# Patient Record
Sex: Female | Born: 1990 | Race: White | Hispanic: No | Marital: Married | Smoking: Never smoker
Health system: Southern US, Community
[De-identification: ages and names within clinical notes are randomized; demographics above are authoritative.]

## PROBLEM LIST (undated history)

## (undated) DIAGNOSIS — E669 Obesity, unspecified: Secondary | ICD-10-CM

## (undated) DIAGNOSIS — Z9189 Other specified personal risk factors, not elsewhere classified: Secondary | ICD-10-CM

## (undated) DIAGNOSIS — Z9289 Personal history of other medical treatment: Secondary | ICD-10-CM

## (undated) DIAGNOSIS — Z1371 Encounter for nonprocreative screening for genetic disease carrier status: Secondary | ICD-10-CM

## (undated) DIAGNOSIS — Z1379 Encounter for other screening for genetic and chromosomal anomalies: Secondary | ICD-10-CM

## (undated) DIAGNOSIS — Z803 Family history of malignant neoplasm of breast: Secondary | ICD-10-CM

## (undated) HISTORY — DX: Personal history of other medical treatment: Z92.89

## (undated) HISTORY — PX: BREAST BIOPSY: SHX20

## (undated) HISTORY — PX: TONSILLECTOMY: SUR1361

## (undated) HISTORY — DX: Encounter for nonprocreative screening for genetic disease carrier status: Z13.71

## (undated) HISTORY — DX: Other specified personal risk factors, not elsewhere classified: Z91.89

## (undated) HISTORY — DX: Family history of malignant neoplasm of breast: Z80.3

## (undated) HISTORY — DX: Encounter for other screening for genetic and chromosomal anomalies: Z13.79

## (undated) HISTORY — DX: Obesity, unspecified: E66.9

---
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
## 2014-04-01 DIAGNOSIS — Z1371 Encounter for nonprocreative screening for genetic disease carrier status: Secondary | ICD-10-CM

## 2014-04-01 DIAGNOSIS — Z1379 Encounter for other screening for genetic and chromosomal anomalies: Secondary | ICD-10-CM

## 2014-04-01 HISTORY — DX: Encounter for other screening for genetic and chromosomal anomalies: Z13.79

## 2014-04-01 HISTORY — DX: Encounter for nonprocreative screening for genetic disease carrier status: Z13.71

## 2014-04-30 LAB — HM PAP SMEAR: HM PAP: NEGATIVE

## 2014-12-17 ENCOUNTER — Encounter: Payer: Self-pay | Admitting: *Deleted

## 2015-01-07 ENCOUNTER — Ambulatory Visit (INDEPENDENT_AMBULATORY_CARE_PROVIDER_SITE_OTHER): Payer: PRIVATE HEALTH INSURANCE | Admitting: Obstetrics and Gynecology

## 2015-01-07 VITALS — BP 130/82 | HR 86 | Ht 70.0 in | Wt 220.2 lb

## 2015-01-07 DIAGNOSIS — E669 Obesity, unspecified: Secondary | ICD-10-CM

## 2015-01-07 NOTE — Progress Notes (Cosign Needed)
Patient ID: Renee JakschElizabeth A Lawrence, female   DOB: [birth year], 24 y.o.   MRN: [id number]  Pt here for weight, B/P check.  Missed last appt because when she came back from vacation she forgot. Does not want B-12 injection due to cost, so this was not given. On second rx for Phentermine.  Weight loss of 1 lb since last visit. B/P down from last visit. No side effects noted.

## 2015-02-04 ENCOUNTER — Ambulatory Visit (INDEPENDENT_AMBULATORY_CARE_PROVIDER_SITE_OTHER): Payer: PRIVATE HEALTH INSURANCE | Admitting: Obstetrics and Gynecology

## 2015-02-04 VITALS — BP 132/84 | HR 78 | Ht 70.0 in | Wt 215.2 lb

## 2015-02-04 DIAGNOSIS — E663 Overweight: Secondary | ICD-10-CM

## 2015-02-04 NOTE — Progress Notes (Cosign Needed)
Pt is here for wt, bp check, pt isnt getting b-12 inj due to cost She denies any s/e from Phentermine, lost 5 lbs this month and actually won a contest for most inches lost,pt was very proud

## 2015-03-02 ENCOUNTER — Ambulatory Visit: Payer: PRIVATE HEALTH INSURANCE

## 2015-03-22 ENCOUNTER — Ambulatory Visit (INDEPENDENT_AMBULATORY_CARE_PROVIDER_SITE_OTHER): Payer: PRIVATE HEALTH INSURANCE | Admitting: Obstetrics and Gynecology

## 2015-03-22 VITALS — BP 128/83 | HR 88 | Ht 70.0 in | Wt 208.5 lb

## 2015-03-22 DIAGNOSIS — E663 Overweight: Secondary | ICD-10-CM

## 2015-03-22 NOTE — Progress Notes (Cosign Needed)
Pt is here for wt, bp check- she doesn't get B-12 due to cost Denies any s/e  01/07/15 wt-220lb 03/23/15 wt- 208lb

## 2015-04-07 ENCOUNTER — Encounter: Payer: Self-pay | Admitting: Obstetrics and Gynecology

## 2015-04-07 ENCOUNTER — Ambulatory Visit (INDEPENDENT_AMBULATORY_CARE_PROVIDER_SITE_OTHER): Payer: BC Managed Care – PPO | Admitting: Obstetrics and Gynecology

## 2015-04-07 VITALS — BP 132/84 | HR 91 | Ht 70.0 in | Wt 214.2 lb

## 2015-04-07 DIAGNOSIS — E669 Obesity, unspecified: Secondary | ICD-10-CM | POA: Diagnosis not present

## 2015-04-07 MED ORDER — PHENTERMINE HCL 37.5 MG PO TABS: 37.5000 mg | ORAL_TABLET | Freq: Every day | ORAL | Status: DC

## 2015-04-07 NOTE — Progress Notes (Signed)
Patient ID: Tyna Jaksch, female   DOB: 26-Apr-1991, 24 y.o.   MRN: [id number]   SUBJECTIVE:  24 y.o. here for follow-up weight loss visit, previously seen 4 weeks ago. Denies any concerns and feels like medication worked well, had not been getting injection due to pharmacy cost $30). Has been off medication x 2 weeks and desires restart.  OBJECTIVE:  BP 132/84 mmHg  Pulse 91  Ht  (1.778 m)  Wt 214 lb 3.2 oz (97.16 kg)  BMI 30.73 kg/m2  LMP 03/10/2015 (Exact Date)  Body mass index is 30.73 kg/(m^2). Patient appears well. ASSESSMENT:  Obesity- responding well to weight loss plan PLAN:  To continue with current medications. B12 1033mcg/ml injection given- will give B12 from our supply each visit, RX for refills on Adipex given. RTC in 4 weeks as planned  Yolanda Bonine, CNM

## 2015-05-05 ENCOUNTER — Ambulatory Visit (INDEPENDENT_AMBULATORY_CARE_PROVIDER_SITE_OTHER): Payer: BC Managed Care – PPO | Admitting: Obstetrics and Gynecology

## 2015-05-05 VITALS — BP 121/81 | HR 86 | Ht 70.0 in | Wt 208.5 lb

## 2015-05-05 DIAGNOSIS — E669 Obesity, unspecified: Secondary | ICD-10-CM

## 2015-05-05 MED ORDER — CYANOCOBALAMIN 1000 MCG/ML IJ SOLN
1000.0000 ug | Freq: Once | INTRAMUSCULAR | Status: AC
  Administered 2015-05-05: 1000 ug via INTRAMUSCULAR

## 2015-05-05 NOTE — Progress Notes (Cosign Needed)
Patient ID: Renee JakschElizabeth A Lawrence, female   DOB: May 23, 1991, 24 y.o.   MRN: [id number] Pt presents for weight, B/P, B-12 injection. No side effects of medication-Phentermine, or B-12.  Weight loss of 6 lbs. Encouraged eating healthy and exercise.

## 2015-06-02 ENCOUNTER — Ambulatory Visit (INDEPENDENT_AMBULATORY_CARE_PROVIDER_SITE_OTHER): Payer: BC Managed Care – PPO | Admitting: Obstetrics and Gynecology

## 2015-06-02 VITALS — BP 125/84 | HR 81 | Ht 70.0 in | Wt 209.4 lb

## 2015-06-02 DIAGNOSIS — E669 Obesity, unspecified: Secondary | ICD-10-CM | POA: Diagnosis not present

## 2015-06-02 MED ORDER — CYANOCOBALAMIN 1000 MCG/ML IJ SOLN
1000.0000 ug | Freq: Once | INTRAMUSCULAR | Status: AC
  Administered 2015-06-02: 1000 ug via INTRAMUSCULAR

## 2015-06-02 NOTE — Progress Notes (Cosign Needed)
Patient ID: Renee JakschElizabeth A Lawrence, female   DOB: 01/02/1991, 24 y.o.   MRN: [id number] Pt presents for weight, B/P, B-12 injection. No side effects of medication-Phentermine, or B-12.  Weight gain of 1 lbs. Encouraged eating healthy and exercise. Pt did have on boots, jeans today as clothing is usually lighter.

## 2015-07-14 ENCOUNTER — Encounter: Payer: Self-pay | Admitting: Obstetrics and Gynecology

## 2015-07-14 ENCOUNTER — Ambulatory Visit (INDEPENDENT_AMBULATORY_CARE_PROVIDER_SITE_OTHER): Payer: BC Managed Care – PPO | Admitting: Obstetrics and Gynecology

## 2015-07-14 VITALS — BP 133/79 | HR 75 | Wt 212.5 lb

## 2015-07-14 DIAGNOSIS — J069 Acute upper respiratory infection, unspecified: Secondary | ICD-10-CM | POA: Diagnosis not present

## 2015-07-14 DIAGNOSIS — E669 Obesity, unspecified: Secondary | ICD-10-CM

## 2015-07-14 MED ORDER — PHENTERMINE HCL 37.5 MG PO TABS: 37.5000 mg | ORAL_TABLET | Freq: Every day | ORAL | Status: DC

## 2015-07-14 MED ORDER — CEFDINIR 300 MG PO CAPS: 300.0000 mg | ORAL_CAPSULE | Freq: Two times a day (BID) | ORAL | Status: DC

## 2015-07-14 NOTE — Progress Notes (Signed)
SUBJECTIVE:  25 y.o. here for follow-up weight loss visit, previously seen 4 weeks ago. Denies any concerns and feels like medication worked well, and has been off x 2 weeks, but gained 3 # back with current cold and not able to exercise, does desires another 3 months to continue to lose weight. Has joined gym and is in weight loss challenge there.   Report onset nasal congestion with chest congestion x 1 week, denies fever, throat sore with swallowing. Green phlegm noted.  OBJECTIVE:  BP 133/79 mmHg  Pulse 75  Wt 212 lb 8 oz (96.389 kg)  LMP 06/20/2015  Body mass index is 30.49 kg/(m^2). Patient appears sickly- lungs with wet cough noted, nasal passages boggy with green discharge noted. + lymphadenopathy bilaterally. ASSESSMENT:  Obesity- responding well to weight loss plan PLAN:   RX for omnicef 300mg  po bid x 7 das, OK to continue with Mucinex D as needed.  Then continue with current medications after 1 week. B12 104200mcg/ml injection given RTC in 5 weeks as planned  Melody LarrabeeShambley, CNM

## 2015-08-18 ENCOUNTER — Ambulatory Visit (INDEPENDENT_AMBULATORY_CARE_PROVIDER_SITE_OTHER): Payer: BC Managed Care – PPO | Admitting: Obstetrics and Gynecology

## 2015-08-18 VITALS — BP 131/96 | HR 96 | Wt 212.0 lb

## 2015-08-18 DIAGNOSIS — E669 Obesity, unspecified: Secondary | ICD-10-CM | POA: Diagnosis not present

## 2015-08-18 MED ORDER — CYANOCOBALAMIN 1000 MCG/ML IJ SOLN
1000.0000 ug | Freq: Once | INTRAMUSCULAR | Status: AC
  Administered 2015-08-18: 1000 ug via INTRAMUSCULAR

## 2015-08-18 NOTE — Progress Notes (Cosign Needed)
Patient ID: Renee Lawrence, female   DOB: 05/07/1991, 25 y.o.   MRN: [id number] Pt presents for weight, B/P, B-12 injection. No side effects of medication-Phentermine, or B-12.  Weight stable at 212 lb.  Encouraged eating healthy and exercise.

## 2015-09-15 ENCOUNTER — Ambulatory Visit (INDEPENDENT_AMBULATORY_CARE_PROVIDER_SITE_OTHER): Payer: BC Managed Care – PPO | Admitting: Obstetrics and Gynecology

## 2015-09-15 VITALS — BP 134/84 | HR 78 | Wt 216.1 lb

## 2015-09-15 DIAGNOSIS — E669 Obesity, unspecified: Secondary | ICD-10-CM | POA: Diagnosis not present

## 2015-09-15 MED ORDER — CYANOCOBALAMIN 1000 MCG/ML IJ SOLN
1000.0000 ug | Freq: Once | INTRAMUSCULAR | Status: AC
  Administered 2015-09-15: 1000 ug via INTRAMUSCULAR

## 2015-09-15 NOTE — Progress Notes (Signed)
Patient ID: Renee JakschElizabeth A Lawrence, female   DOB: 10-03-1990, 25 y.o.   MRN: [id number] Pt presents for weight, B/P, B-12 injection. No side effects of medication-Phentermine, or B-12.  Weight gain of 4 lbs. Encouraged eating healthy and exercise. Pt states she is stopping her phentermine and B-12 injection. Pt has a lot going on. [facility] but wants to do her next one here.

## 2015-10-06 ENCOUNTER — Telehealth: Payer: Self-pay | Admitting: Obstetrics and Gynecology

## 2015-10-06 ENCOUNTER — Other Ambulatory Visit: Payer: Self-pay | Admitting: Obstetrics and Gynecology

## 2015-10-06 NOTE — Telephone Encounter (Signed)
Pt does not have a primary dr. She has hjad diarrhea since Monday. She has taken immodium, pepto for it and its not working. Can you call her. Now she has a hemarhoid that is hanging out

## 2015-10-06 NOTE — Telephone Encounter (Signed)
Pt is still having diarrhea, she is concerned b/c she has had to have a hemorrhoid lanced, she feels as if that's whats going on?? pls advise- cant we lance it???

## 2015-10-06 NOTE — Telephone Encounter (Signed)
Come in to be seen tomorrow

## 2015-10-07 NOTE — Telephone Encounter (Signed)
I CALLED PT AND LEFT HER A MESSAGE TO CALL OFFICE FIRST THING THIS MORNING SINCE IT WAS AFTER 5 TO GET AN APPT. SHE CALLED THIS MORNING AND STATED SHE WAS GOING TO WAIT THRU THE WEEKEND AND SEE IF ITS BETTER AND IF NOT THEN CALL [place]

## 2016-09-20 ENCOUNTER — Ambulatory Visit: Payer: Self-pay | Admitting: Obstetrics and Gynecology

## 2016-11-02 ENCOUNTER — Ambulatory Visit: Payer: Self-pay | Admitting: Obstetrics and Gynecology

## 2016-11-14 ENCOUNTER — Telehealth: Payer: Self-pay

## 2016-11-14 MED ORDER — NORETHIN ACE-ETH ESTRAD-FE 1-20 MG-MCG PO TABS: 1.0000 | ORAL_TABLET | Freq: Every day | ORAL | 1 refills | Status: DC

## 2016-11-14 NOTE — Telephone Encounter (Signed)
Pt calling for refill of bcps to get her to her annual on 6/26th. Pt aware refills eRx'd.

## 2016-12-25 ENCOUNTER — Encounter: Payer: Self-pay | Admitting: Obstetrics and Gynecology

## 2016-12-25 ENCOUNTER — Ambulatory Visit (INDEPENDENT_AMBULATORY_CARE_PROVIDER_SITE_OTHER): Payer: BC Managed Care – PPO | Admitting: Obstetrics and Gynecology

## 2016-12-25 VITALS — BP 124/84 | HR 93 | Ht 70.0 in | Wt 233.0 lb

## 2016-12-25 DIAGNOSIS — Z9189 Other specified personal risk factors, not elsewhere classified: Secondary | ICD-10-CM | POA: Diagnosis not present

## 2016-12-25 DIAGNOSIS — Z803 Family history of malignant neoplasm of breast: Secondary | ICD-10-CM

## 2016-12-25 DIAGNOSIS — Z3041 Encounter for surveillance of contraceptive pills: Secondary | ICD-10-CM | POA: Diagnosis not present

## 2016-12-25 DIAGNOSIS — Z124 Encounter for screening for malignant neoplasm of cervix: Secondary | ICD-10-CM

## 2016-12-25 DIAGNOSIS — Z01419 Encounter for gynecological examination (general) (routine) without abnormal findings: Secondary | ICD-10-CM

## 2016-12-25 MED ORDER — NORETHIN ACE-ETH ESTRAD-FE 1-20 MG-MCG PO TABS: 1.0000 | ORAL_TABLET | Freq: Every day | ORAL | 12 refills | Status: DC

## 2016-12-25 NOTE — Progress Notes (Signed)
Chief Complaint  Patient presents with  . Gynecologic Exam     HPI:      [name] is a 26 y.o. G0P0000 who LMP was Patient's last menstrual period was 12/11/2016., presents today for her annual examination.  Her menses are infrequent with OCPs. She occas has a few days of spotting. Dysmenorrhea none. She does not have intermenstrual bleeding.  Sex activity: single partner, contraception - OCP (estrogen/progesterone).  Last Pap: April 30, 2014  Results were: no abnormalities  Hx of STDs: HPV  There is a strong FH of breast cancer. There is no FH of ovarian cancer. The patient does do self-breast exams. Pt is My Risk neg 10/15; IBIS=32%.  Tobacco use: The patient denies current or previous tobacco use. Alcohol use: social drinker Exercise: moderately active  She does not get adequate calcium and Vitamin D in her diet.   Past Medical History:  Diagnosis Date  . Family history of breast cancer   . Genetic testing of female 04/2014   My Risk neg  . History of Papanicolaou smear of cervix 02/09/2011; 04/30/2014   neg; neg  . Increased risk of breast cancer    IBIS=32% 2015  . Obesity     Past Surgical History:  Procedure Laterality Date  . TONSILLECTOMY      Family History  Problem Relation Age of Onset  . Diabetes Sister   . Cancer Maternal Grandmother 50       breast  . Cancer Paternal Grandmother [id number]       breast  . Diabetes Brother        TYPE I - on insulin  . Cancer Maternal Grandfather        lung  . Cancer Paternal Aunt [id number]       breast    Social History   Social History  . Marital status: Single    Spouse name: N/A  . Number of children: 0  . Years of education: 6516   Occupational History  . TEACHER    Social History Main Topics  . Smoking status: Never Smoker  . Smokeless tobacco: Never Used  . Alcohol use Yes     Comment: occas  . Drug use: No  . Sexual activity: Yes    Birth control/ protection: Pill   Other Topics  Concern  . Not on file   Social History Narrative  . No narrative on file     Current Outpatient Prescriptions:  .  norethindrone-ethinyl estradiol (JUNEL FE,GILDESS FE,LOESTRIN FE) 1-20 MG-MCG tablet, Take 1 tablet by mouth daily., Disp: 1 Package, Rfl: 12  ROS:  Review of Systems  Constitutional: Negative for fatigue, fever and unexpected weight change.  Respiratory: Negative for cough, shortness of breath and wheezing.   Cardiovascular: Negative for chest pain, palpitations and leg swelling.  Gastrointestinal: Negative for blood in stool, constipation, diarrhea, nausea and vomiting.  Endocrine: Negative for cold intolerance, heat intolerance and polyuria.  Genitourinary: Negative for dyspareunia, dysuria, flank pain, frequency, genital sores, hematuria, menstrual problem, pelvic pain, urgency, vaginal bleeding, vaginal discharge and vaginal pain.  Musculoskeletal: Negative for back pain, joint swelling and myalgias.  Skin: Negative for rash.  Neurological: Negative for dizziness, syncope, light-headedness, numbness and headaches.  Hematological: Negative for adenopathy.  Psychiatric/Behavioral: Negative for agitation, confusion, sleep disturbance and suicidal ideas. The patient is not nervous/anxious.      Objective: BP 124/84 (BP Location: Left Arm, Patient Position: Sitting, Cuff Size: Large)   Pulse 93  Ht 5\' 10"  (1.778 m)   Wt 233 lb (105.7 kg)   LMP 12/11/2016   BMI 33.43 kg/m    Physical Exam  Constitutional: She is oriented to person, place, and time. She appears well-developed and well-nourished.  Genitourinary: Vagina normal and uterus normal. There is no rash or tenderness on the right labia. There is no rash or tenderness on the left labia. No erythema or tenderness in the vagina. No vaginal discharge found. Right adnexum does not display mass and does not display tenderness. Left adnexum does not display mass and does not display tenderness. Cervix does not  exhibit motion tenderness or polyp. Uterus is not enlarged or tender.  Neck: Normal range of motion. No thyromegaly present.  Cardiovascular: Normal rate, regular rhythm and normal heart sounds.   No murmur heard. Pulmonary/Chest: Effort normal and breath sounds normal. Right breast exhibits no mass, no nipple discharge, no skin change and no tenderness. Left breast exhibits no mass, no nipple discharge, no skin change and no tenderness.  Abdominal: Soft. There is no tenderness. There is no guarding.  Musculoskeletal: Normal range of motion.  Neurological: She is alert and oriented to person, place, and time. No cranial nerve deficit.  Psychiatric: She has a normal mood and affect. Her behavior is normal.  Vitals reviewed.    Assessment/Plan: Encounter for annual routine gynecological examination  Cervical cancer screening - Plan: IGP, rfx Aptima HPV ASCU  Encounter for surveillance of contraceptive pills - OCP RF. - Plan: norethindrone-ethinyl estradiol (JUNEL FE,GILDESS FE,LOESTRIN FE) 1-20 MG-MCG tablet  Increased risk of breast cancer - Pt to start mammos age 26.Will recalculate TC model age 26 as well for screen breast MRI recommendations. Cont SBE.  Add Vit D3 2000 IU daily.  Family history of breast cancer             GYN counsel breast self exam, mammography screening, adequate intake of calcium and vitamin D, diet and exercise     F/U  Return in about 1 year (around 12/25/2017).  Ryley Teater B. Montravious Weigelt, PA-C 12/25/2016 3:38 PM

## 2016-12-27 LAB — IGP, RFX APTIMA HPV ASCU: PAP SMEAR COMMENT: 0

## 2017-01-03 ENCOUNTER — Other Ambulatory Visit: Payer: Self-pay | Admitting: Obstetrics and Gynecology

## 2017-12-20 ENCOUNTER — Other Ambulatory Visit: Payer: Self-pay | Admitting: Obstetrics and Gynecology

## 2017-12-20 DIAGNOSIS — Z3041 Encounter for surveillance of contraceptive pills: Secondary | ICD-10-CM

## 2018-02-13 ENCOUNTER — Ambulatory Visit (INDEPENDENT_AMBULATORY_CARE_PROVIDER_SITE_OTHER): Payer: BC Managed Care – PPO | Admitting: Obstetrics and Gynecology

## 2018-02-13 ENCOUNTER — Other Ambulatory Visit (HOSPITAL_COMMUNITY)
Admission: RE | Admit: 2018-02-13 | Discharge: 2018-02-13 | Disposition: A | Discharge status: Home / Self Care | Payer: BC Managed Care – PPO | Source: Ambulatory Visit | Attending: Obstetrics and Gynecology | Admitting: Obstetrics and Gynecology

## 2018-02-13 ENCOUNTER — Encounter: Payer: Self-pay | Admitting: Obstetrics and Gynecology

## 2018-02-13 VITALS — BP 120/80 | HR 91 | Ht 70.0 in | Wt 253.0 lb

## 2018-02-13 DIAGNOSIS — R1013 Epigastric pain: Secondary | ICD-10-CM | POA: Insufficient documentation

## 2018-02-13 DIAGNOSIS — Z124 Encounter for screening for malignant neoplasm of cervix: Secondary | ICD-10-CM | POA: Insufficient documentation

## 2018-02-13 DIAGNOSIS — Z01411 Encounter for gynecological examination (general) (routine) with abnormal findings: Secondary | ICD-10-CM | POA: Diagnosis not present

## 2018-02-13 DIAGNOSIS — Z3041 Encounter for surveillance of contraceptive pills: Secondary | ICD-10-CM

## 2018-02-13 DIAGNOSIS — Z01419 Encounter for gynecological examination (general) (routine) without abnormal findings: Secondary | ICD-10-CM

## 2018-02-13 MED ORDER — NORETHIN ACE-ETH ESTRAD-FE 1-20 MG-MCG PO TABS: 1.0000 | ORAL_TABLET | Freq: Every day | ORAL | 3 refills | Status: DC

## 2018-02-13 NOTE — Progress Notes (Signed)
Chief Complaint  Patient presents with  . Gynecologic Exam    Pt states having stomach issues, when she eats she gets naucious, stomach pain "numbing pain per pt"     HPI:      [name] is a 27 y.o. G0P0000 who LMP was Patient's last menstrual period was 02/05/2018 (exact date)., presents today for her annual examination.  Her menses are infrequent with OCPs. She occas has a few days of spotting. Dysmenorrhea none. She does not have intermenstrual bleeding.  Sex activity: single partner, contraception - OCP (estrogen/progesterone).  Last Pap: 12/25/16 Results were: no abnormalities  Hx of STDs: HPV  There is a strong FH of breast cancer on her mat and pat sides. There is no FH of ovarian cancer. The patient does do self-breast exams. Pt is My Risk neg 10/15; IBIS=32%.  Tobacco use: The patient denies current or previous tobacco use. Alcohol use: social drinker Exercise: moderately active  She does get adequate calcium but not Vitamin D in her diet.  Has had nausea and epigastric pain with certain foods for the past few months. Has occas watery diarrhea which improves abd pain. No new meds, foreign travel, fevers. No vomiting usually, no anorexia. Thinks she has GERD and has tried Tums without sx relief of abd pain. Has gone gluten free and started probiotics with significant improvement. Has lost a few pounds since gluten free, but no significant wt loss.    Past Medical History:  Diagnosis Date  . BRCA negative 04/2014   MyRisk neg  . Family history of breast cancer   . Genetic testing of female 04/2014   My Risk neg  . History of Papanicolaou smear of cervix 02/09/2011; 04/30/2014   neg; neg  . Increased risk of breast cancer    IBIS=32% 2015  . Obesity     Past Surgical History:  Procedure Laterality Date  . TONSILLECTOMY      Family History  Problem Relation Age of Onset  . Diabetes Sister   . Breast cancer Maternal Grandmother 22  . Breast cancer  Paternal Grandmother 17       again at 59  . Diabetes Brother        TYPE I - on insulin  . Cancer Maternal Grandfather        lung  . Breast cancer Paternal Aunt 98    Social History   Socioeconomic History  . Marital status: Single    Spouse name: Not on file  . Number of children: 0  . Years of education: 49  . Highest education level: Not on file  Occupational History  . Occupation: TEACHER  Social Needs  . Financial resource strain: Not on file  . Food insecurity:    Worry: Not on file    Inability: Not on file  . Transportation needs:    Medical: Not on file    Non-medical: Not on file  Tobacco Use  . Smoking status: Never Smoker  . Smokeless tobacco: Never Used  Substance and Sexual Activity  . Alcohol use: Yes    Comment: occas  . Drug use: No  . Sexual activity: Yes    Birth control/protection: Pill  Lifestyle  . Physical activity:    Days per week: Not on file    Minutes per session: Not on file  . Stress: Not on file  Relationships  . Social connections:    Talks on phone: Not on file    Gets together:  Not on file    Attends religious service: Not on file    Active member of club or organization: Not on file    Attends meetings of clubs or organizations: Not on file    Relationship status: Not on file  . Intimate partner violence:    Fear of current or ex partner: Not on file    Emotionally abused: Not on file    Physically abused: Not on file    Forced sexual activity: Not on file  Other Topics Concern  . Not on file  Social History Narrative  . Not on file     Current Outpatient Medications:  .  norethindrone-ethinyl estradiol (JUNEL FE 1/20) 1-20 MG-MCG tablet, Take 1 tablet by mouth daily., Disp: 84 tablet, Rfl: 3  ROS:  Review of Systems  Constitutional: Negative for fatigue, fever and unexpected weight change.  Respiratory: Negative for cough, shortness of breath and wheezing.   Cardiovascular: Negative for chest pain, palpitations  and leg swelling.  Gastrointestinal: Positive for abdominal pain, diarrhea and nausea. Negative for blood in stool, constipation and vomiting.  Endocrine: Negative for cold intolerance, heat intolerance and polyuria.  Genitourinary: Negative for dyspareunia, dysuria, flank pain, frequency, genital sores, hematuria, menstrual problem, pelvic pain, urgency, vaginal bleeding, vaginal discharge and vaginal pain.  Musculoskeletal: Negative for back pain, joint swelling and myalgias.  Skin: Negative for rash.  Neurological: Negative for dizziness, syncope, light-headedness, numbness and headaches.  Hematological: Negative for adenopathy.  Psychiatric/Behavioral: Negative for agitation, confusion, sleep disturbance and suicidal ideas. The patient is not nervous/anxious.      Objective: BP 120/80   Pulse 91   Ht 5' 10" (1.778 m)   Wt 253 lb (114.8 kg)   LMP 02/05/2018 (Exact Date)   BMI 36.30 kg/m    Physical Exam  Constitutional: She is oriented to person, place, and time. She appears well-developed and well-nourished.  Genitourinary: Vagina normal and uterus normal. There is no rash or tenderness on the right labia. There is no rash or tenderness on the left labia. No erythema or tenderness in the vagina. No vaginal discharge found. Right adnexum does not display mass and does not display tenderness. Left adnexum does not display mass and does not display tenderness. Cervix does not exhibit motion tenderness or polyp. Uterus is not enlarged or tender.  Neck: Normal range of motion. No thyromegaly present.  Cardiovascular: Normal rate, regular rhythm and normal heart sounds.  No murmur heard. Pulmonary/Chest: Effort normal and breath sounds normal. Right breast exhibits no mass, no nipple discharge, no skin change and no tenderness. Left breast exhibits no mass, no nipple discharge, no skin change and no tenderness.  Abdominal: Soft. There is no tenderness. There is no guarding.    Musculoskeletal: Normal range of motion.  Neurological: She is alert and oriented to person, place, and time. No cranial nerve deficit.  Psychiatric: She has a normal mood and affect. Her behavior is normal.  Vitals reviewed.    Assessment/Plan: Encounter for annual routine gynecological examination  Cervical cancer screening - Plan: Cytology - PAP  Encounter for surveillance of contraceptive pills - OCP RF - Plan: norethindrone-ethinyl estradiol (JUNEL FE 1/20) 1-20 MG-MCG tablet  Encounter for surveillance of contraceptive pills - OCP RF. - Plan: norethindrone-ethinyl estradiol (JUNEL FE 1/20) 1-20 MG-MCG tablet  Epigastric pain - With occas diarrhea. Suggested diary re: food allergy/sensitivity. Question stress/IBS. F/u wtih PCP for further eval/mgmt.             GYN  counsel breast self exam, mammography screening, adequate intake of calcium and vitamin D, diet and exercise     F/U  Return in about 1 year (around 02/14/2019).  Alicia B. Copland, PA-C 02/13/2018 10:48 AM

## 2018-02-13 NOTE — Patient Instructions (Signed)
I value your feedback and entrusting us with your care. If you get a [place] patient survey, I would appreciate you taking the time to let us know about your experience today. Thank you! 

## 2018-02-14 LAB — CYTOLOGY - PAP: DIAGNOSIS: NEGATIVE

## 2018-03-12 ENCOUNTER — Other Ambulatory Visit: Payer: Self-pay | Admitting: Obstetrics and Gynecology

## 2018-03-12 DIAGNOSIS — Z3041 Encounter for surveillance of contraceptive pills: Secondary | ICD-10-CM

## 2018-07-14 ENCOUNTER — Other Ambulatory Visit: Payer: Self-pay | Admitting: Obstetrics and Gynecology

## 2018-07-14 DIAGNOSIS — Z3041 Encounter for surveillance of contraceptive pills: Secondary | ICD-10-CM

## 2019-04-12 ENCOUNTER — Other Ambulatory Visit: Payer: Self-pay | Admitting: Obstetrics and Gynecology

## 2019-04-12 DIAGNOSIS — Z3041 Encounter for surveillance of contraceptive pills: Secondary | ICD-10-CM

## 2019-04-14 ENCOUNTER — Other Ambulatory Visit: Payer: Self-pay

## 2019-04-14 DIAGNOSIS — Z3041 Encounter for surveillance of contraceptive pills: Secondary | ICD-10-CM

## 2019-04-14 MED ORDER — NORETHIN ACE-ETH ESTRAD-FE 1-20 MG-MCG PO TABS: 1.0000 | ORAL_TABLET | Freq: Every day | ORAL | 0 refills | Status: DC

## 2019-04-14 NOTE — Telephone Encounter (Signed)
Pt calling; has scheduled annual for 05/12/19; needs refill of junel to get to appt.  [phone]  Pt aware by detailed msg refill eRx'd.

## 2019-05-12 ENCOUNTER — Ambulatory Visit (INDEPENDENT_AMBULATORY_CARE_PROVIDER_SITE_OTHER): Payer: BC Managed Care – PPO | Admitting: Obstetrics and Gynecology

## 2019-05-12 ENCOUNTER — Encounter: Payer: Self-pay | Admitting: Obstetrics and Gynecology

## 2019-05-12 ENCOUNTER — Other Ambulatory Visit: Payer: Self-pay

## 2019-05-12 VITALS — BP 140/90 | Ht 70.0 in | Wt 238.0 lb

## 2019-05-12 DIAGNOSIS — Z01419 Encounter for gynecological examination (general) (routine) without abnormal findings: Secondary | ICD-10-CM | POA: Diagnosis not present

## 2019-05-12 DIAGNOSIS — Z9189 Other specified personal risk factors, not elsewhere classified: Secondary | ICD-10-CM

## 2019-05-12 DIAGNOSIS — Z3169 Encounter for other general counseling and advice on procreation: Secondary | ICD-10-CM

## 2019-05-12 DIAGNOSIS — Z3041 Encounter for surveillance of contraceptive pills: Secondary | ICD-10-CM

## 2019-05-12 DIAGNOSIS — Z803 Family history of malignant neoplasm of breast: Secondary | ICD-10-CM

## 2019-05-12 MED ORDER — NORETHIN ACE-ETH ESTRAD-FE 1-20 MG-MCG PO TABS: 1.0000 | ORAL_TABLET | Freq: Every day | ORAL | 3 refills | Status: DC

## 2019-05-12 NOTE — Patient Instructions (Signed)
I value your feedback and entrusting us with your care. If you get a [place] patient survey, I would appreciate you taking the time to let us know about your experience today. Thank you! 

## 2019-05-12 NOTE — Progress Notes (Signed)
Chief Complaint  Patient presents with  . Gynecologic Exam    flu shot     HPI:      [name] is a 28 y.o. G0P0000 who LMP was Patient's last menstrual period was 05/06/2019 (exact date)., presents today for her annual examination.  Her menses are infrequent with OCPs. She occas has a few days of spotting. Dysmenorrhea none. She does not have intermenstrual bleeding. She had a heavier period this month on placebo pills, lasting 7 days, mod flow. Has lost wt with keto and broke her ankle recently. Also is at end of grad program with increased stress.  Sex activity: single partner, contraception - OCP (estrogen/progesterone). May want to conceive next yr. Last Pap: 02/13/18 Results were: no abnormalities  Hx of STDs: HPV  There is a strong FH of breast cancer on her mat and pat sides. There is no FH of ovarian cancer. The patient does do self-breast exams. Pt is My Risk neg 10/15; IBIS=32%.  Tobacco use: The patient denies current or previous tobacco use. Alcohol use: social drinker  No drug use. Exercise: moderately active  She does get adequate calcium but not Vitamin D in her diet.   Past Medical History:  Diagnosis Date  . BRCA negative 04/2014   MyRisk neg  . Family history of breast cancer   . Genetic testing of female 04/2014   My Risk neg  . History of Papanicolaou smear of cervix 02/09/2011; 04/30/2014   neg; neg  . Increased risk of breast cancer    IBIS=32% 2015  . Obesity     Past Surgical History:  Procedure Laterality Date  . TONSILLECTOMY      Family History  Problem Relation Age of Onset  . Diabetes Sister   . Breast cancer Maternal Grandmother 42       has contact  . Breast cancer Paternal Grandmother 52       again at 19  . Diabetes Brother        TYPE I - on insulin  . Cancer Maternal Grandfather        lung  . Breast cancer Paternal Aunt 72       has contact    Social History   Socioeconomic History  . Marital status:  Single    Spouse name: Not on file  . Number of children: 0  . Years of education: 56  . Highest education level: Not on file  Occupational History  . Occupation: TEACHER  Social Needs  . Financial resource strain: Not on file  . Food insecurity    Worry: Not on file    Inability: Not on file  . Transportation needs    Medical: Not on file    Non-medical: Not on file  Tobacco Use  . Smoking status: Never Smoker  . Smokeless tobacco: Never Used  Substance and Sexual Activity  . Alcohol use: Yes    Comment: occas  . Drug use: No  . Sexual activity: Yes    Birth control/protection: Pill  Lifestyle  . Physical activity    Days per week: Not on file    Minutes per session: Not on file  . Stress: Not on file  Relationships  . Social Herbalist on phone: Not on file    Gets together: Not on file    Attends religious service: Not on file    Active member of club or organization: Not on file  Attends meetings of clubs or organizations: Not on file    Relationship status: Not on file  . Intimate partner violence    Fear of current or ex partner: Not on file    Emotionally abused: Not on file    Physically abused: Not on file    Forced sexual activity: Not on file  Other Topics Concern  . Not on file  Social History Narrative  . Not on file     Current Outpatient Medications:  .  norethindrone-ethinyl estradiol (JUNEL FE 1/20) 1-20 MG-MCG tablet, Take 1 tablet by mouth daily., Disp: 84 tablet, Rfl: 3  ROS:  Review of Systems  Constitutional: Negative for fatigue, fever and unexpected weight change.  Respiratory: Negative for cough, shortness of breath and wheezing.   Cardiovascular: Negative for chest pain, palpitations and leg swelling.  Gastrointestinal: Negative for abdominal pain, blood in stool, constipation, diarrhea, nausea and vomiting.  Endocrine: Negative for cold intolerance, heat intolerance and polyuria.  Genitourinary: Negative for  dyspareunia, dysuria, flank pain, frequency, genital sores, hematuria, menstrual problem, pelvic pain, urgency, vaginal bleeding, vaginal discharge and vaginal pain.  Musculoskeletal: Negative for back pain, joint swelling and myalgias.  Skin: Negative for rash.  Neurological: Negative for dizziness, syncope, light-headedness, numbness and headaches.  Hematological: Negative for adenopathy.  Psychiatric/Behavioral: Negative for agitation, confusion, sleep disturbance and suicidal ideas. The patient is not nervous/anxious.      Objective: BP 140/90   Ht 5' 10" (1.778 m)   Wt 238 lb (108 kg)   LMP 05/06/2019 (Exact Date)   BMI 34.15 kg/m    Physical Exam Constitutional:      Appearance: She is well-developed.  Genitourinary:     Vulva, vagina, uterus, right adnexa and left adnexa normal.     No vulval lesion or tenderness noted.     No vaginal discharge, erythema or tenderness.     No cervical motion tenderness or polyp.     Uterus is not enlarged or tender.     No right or left adnexal mass present.     Right adnexa not tender.     Left adnexa not tender.  Neck:     Musculoskeletal: Normal range of motion.     Thyroid: No thyromegaly.  Cardiovascular:     Rate and Rhythm: Normal rate and regular rhythm.     Heart sounds: Normal heart sounds. No murmur.  Pulmonary:     Effort: Pulmonary effort is normal.     Breath sounds: Normal breath sounds.  Chest:     Breasts:        Right: No mass, nipple discharge, skin change or tenderness.        Left: No mass, nipple discharge, skin change or tenderness.  Abdominal:     Palpations: Abdomen is soft.     Tenderness: There is no abdominal tenderness. There is no guarding.  Musculoskeletal: Normal range of motion.  Neurological:     General: No focal deficit present.     Mental Status: She is alert and oriented to person, place, and time.     Cranial Nerves: No cranial nerve deficit.  Skin:    General: Skin is warm and dry.   Psychiatric:        Mood and Affect: Mood normal.        Behavior: Behavior normal.        Thought Content: Thought content normal.        Judgment: Judgment normal.  Vitals signs reviewed.  Assessment/Plan: Encounter for annual routine gynecological examination  Encounter for surveillance of contraceptive pills - Plan: norethindrone-ethinyl estradiol (JUNEL FE 1/20) 1-20 MG-MCG tablet; OCP RF.   Family history of breast cancer--MyRisk neg.   Increased risk of breast cancer--IBIS=32%. Cont monthly SBE, yearly CBE, Start yearly mammos age 67. Add Vit D supp.   Pre-conception counseling--start PNVs before conception.             Meds ordered this encounter  Medications  . norethindrone-ethinyl estradiol (JUNEL FE 1/20) 1-20 MG-MCG tablet    Sig: Take 1 tablet by mouth daily.    Dispense:  84 tablet    Refill:  3    Order Specific Question:   Supervising Provider    Answer:   Gae Dry [[id number]]    GYN counsel breast self exam, mammography screening, adequate intake of calcium and vitamin D, diet and exercise     F/U  Return in about 1 year (around 05/11/2020).  Alicia B. Copland, PA-C 05/12/2019 4:37 PM

## 2019-07-04 ENCOUNTER — Other Ambulatory Visit: Payer: Self-pay | Admitting: Obstetrics and Gynecology

## 2019-07-04 DIAGNOSIS — Z3041 Encounter for surveillance of contraceptive pills: Secondary | ICD-10-CM

## 2019-09-08 ENCOUNTER — Ambulatory Visit: Payer: BC Managed Care – PPO | Admitting: Obstetrics and Gynecology

## 2020-05-09 ENCOUNTER — Other Ambulatory Visit: Payer: Self-pay | Admitting: Obstetrics and Gynecology

## 2020-05-09 DIAGNOSIS — Z3041 Encounter for surveillance of contraceptive pills: Secondary | ICD-10-CM

## 2020-05-10 ENCOUNTER — Other Ambulatory Visit: Payer: Self-pay

## 2020-05-10 DIAGNOSIS — Z3041 Encounter for surveillance of contraceptive pills: Secondary | ICD-10-CM

## 2020-05-10 MED ORDER — NORETHIN ACE-ETH ESTRAD-FE 1-20 MG-MCG PO TABS: 1.0000 | ORAL_TABLET | Freq: Every day | ORAL | 0 refills | Status: DC

## 2020-05-10 NOTE — Telephone Encounter (Signed)
Pt calling for refill of bc to get her to her appt 12/13.  [phone]  Pt aware refill eRx'd/

## 2020-06-13 ENCOUNTER — Other Ambulatory Visit: Payer: Self-pay

## 2020-06-13 ENCOUNTER — Encounter: Payer: Self-pay | Admitting: Obstetrics and Gynecology

## 2020-06-13 ENCOUNTER — Ambulatory Visit (INDEPENDENT_AMBULATORY_CARE_PROVIDER_SITE_OTHER): Payer: BC Managed Care – PPO | Admitting: Obstetrics and Gynecology

## 2020-06-13 ENCOUNTER — Other Ambulatory Visit (HOSPITAL_COMMUNITY)
Admission: RE | Admit: 2020-06-13 | Discharge: 2020-06-13 | Disposition: A | Discharge status: Home / Self Care | Payer: BC Managed Care – PPO | Source: Ambulatory Visit | Attending: Obstetrics and Gynecology | Admitting: Obstetrics and Gynecology

## 2020-06-13 VITALS — BP 130/70 | Ht 70.0 in | Wt 234.0 lb

## 2020-06-13 DIAGNOSIS — Z124 Encounter for screening for malignant neoplasm of cervix: Secondary | ICD-10-CM

## 2020-06-13 DIAGNOSIS — Z3041 Encounter for surveillance of contraceptive pills: Secondary | ICD-10-CM

## 2020-06-13 DIAGNOSIS — Z1151 Encounter for screening for human papillomavirus (HPV): Secondary | ICD-10-CM | POA: Diagnosis present

## 2020-06-13 DIAGNOSIS — Z23 Encounter for immunization: Secondary | ICD-10-CM | POA: Diagnosis not present

## 2020-06-13 DIAGNOSIS — Z01419 Encounter for gynecological examination (general) (routine) without abnormal findings: Secondary | ICD-10-CM | POA: Diagnosis not present

## 2020-06-13 DIAGNOSIS — Z803 Family history of malignant neoplasm of breast: Secondary | ICD-10-CM

## 2020-06-13 DIAGNOSIS — Z1231 Encounter for screening mammogram for malignant neoplasm of breast: Secondary | ICD-10-CM

## 2020-06-13 DIAGNOSIS — Z9189 Other specified personal risk factors, not elsewhere classified: Secondary | ICD-10-CM

## 2020-06-13 MED ORDER — NORETHIN ACE-ETH ESTRAD-FE 1-20 MG-MCG PO TABS: 1.0000 | ORAL_TABLET | Freq: Every day | ORAL | 3 refills | Status: DC

## 2020-06-13 NOTE — Progress Notes (Signed)
Chief Complaint  Patient presents with  . Gynecologic Exam  . Injections    flu     HPI:      [name] is a 29 y.o. G0P0000 who LMP was Patient's last menstrual period was 05/04/2020 (exact date)., presents today for her annual examination.  Her menses are infrequent with OCPs. She occas has a few days of spotting. Dysmenorrhea none. She does not have intermenstrual bleeding.   Sex activity: single partner, contraception - OCP (estrogen/progesterone). May want to conceive next yr. Last Pap: 02/13/18 Results were: no abnormalities  Hx of STDs: HPV  There is a strong FH of breast cancer on her mat and pat sides. There is no FH of ovarian cancer. The patient does do self-breast exams. Pt is My Risk neg 10/15; IBIS=32%.   Tobacco use: The patient denies current or previous tobacco use. Alcohol use: social drinker  No drug use. Exercise: moderately active  She does get adequate calcium and Vitamin D in her diet.   Past Medical History:  Diagnosis Date  . BRCA negative 04/2014   MyRisk neg  . Family history of breast cancer   . Genetic testing of female 04/2014   My Risk neg  . History of Papanicolaou smear of cervix 02/09/2011; 04/30/2014   neg; neg  . Increased risk of breast cancer    IBIS=32% 2015  . Obesity     Past Surgical History:  Procedure Laterality Date  . TONSILLECTOMY      Family History  Problem Relation Age of Onset  . Diabetes Sister   . Breast cancer Maternal Grandmother 39       has contact  . Breast cancer Paternal Grandmother 63       again at 52  . Diabetes Brother        TYPE I - on insulin  . Cancer Maternal Grandfather        lung  . Breast cancer Paternal Aunt 9       has contact    Social History   Socioeconomic History  . Marital status: Single    Spouse name: Not on file  . Number of children: 0  . Years of education: 51  . Highest education level: Not on file  Occupational History  . Occupation: TEACHER   Tobacco Use  . Smoking status: Never Smoker  . Smokeless tobacco: Never Used  Vaping Use  . Vaping Use: Never used  Substance and Sexual Activity  . Alcohol use: Yes    Comment: occas  . Drug use: No  . Sexual activity: Yes    Birth control/protection: Pill  Other Topics Concern  . Not on file  Social History Narrative  . Not on file   Social Determinants of Health   Financial Resource Strain: Not on file  Food Insecurity: Not on file  Transportation Needs: Not on file  Physical Activity: Not on file  Stress: Not on file  Social Connections: Not on file  Intimate Partner Violence: Not on file     Current Outpatient Medications:  .  norethindrone-ethinyl estradiol (JUNEL FE 1/20) 1-20 MG-MCG tablet, Take 1 tablet by mouth daily., Disp: 84 tablet, Rfl: 3  ROS:  Review of Systems  Constitutional: Negative for fatigue, fever and unexpected weight change.  Respiratory: Negative for cough, shortness of breath and wheezing.   Cardiovascular: Negative for chest pain, palpitations and leg swelling.  Gastrointestinal: Negative for abdominal pain, blood in stool, constipation, diarrhea, nausea and  vomiting.  Endocrine: Negative for cold intolerance, heat intolerance and polyuria.  Genitourinary: Negative for dyspareunia, dysuria, flank pain, frequency, genital sores, hematuria, menstrual problem, pelvic pain, urgency, vaginal bleeding, vaginal discharge and vaginal pain.  Musculoskeletal: Negative for back pain, joint swelling and myalgias.  Skin: Negative for rash.  Neurological: Negative for dizziness, syncope, light-headedness, numbness and headaches.  Hematological: Negative for adenopathy.  Psychiatric/Behavioral: Negative for agitation, confusion, sleep disturbance and suicidal ideas. The patient is not nervous/anxious.      Objective: BP 130/70   Ht _0  (1.778 m)   Wt 234 lb (106.1 kg)   LMP 05/04/2020 (Exact Date)   BMI 33.58 kg/m    Physical  Exam Constitutional:      Appearance: She is well-developed.  Genitourinary:     Vulva, vagina, uterus, right adnexa and left adnexa normal.     No labial lesion or tenderness noted.     No vaginal discharge, erythema or tenderness.      Right Adnexa: not tender and no mass present.    Left Adnexa: not tender and no mass present.    No cervical motion tenderness or polyp.     Uterus is not enlarged or tender.  Breasts:     Right: No mass, nipple discharge, skin change or tenderness.     Left: No mass, nipple discharge, skin change or tenderness.    Neck:     Thyroid: No thyromegaly.  Cardiovascular:     Rate and Rhythm: Normal rate and regular rhythm.     Heart sounds: Normal heart sounds. No murmur heard.   Pulmonary:     Effort: Pulmonary effort is normal.     Breath sounds: Normal breath sounds.  Abdominal:     Palpations: Abdomen is soft.     Tenderness: There is no abdominal tenderness. There is no guarding.  Musculoskeletal:        General: Normal range of motion.     Cervical back: Normal range of motion.  Neurological:     General: No focal deficit present.     Mental Status: She is alert and oriented to person, place, and time.     Cranial Nerves: No cranial nerve deficit.  Skin:    General: Skin is warm and dry.  Psychiatric:        Mood and Affect: Mood normal.        Behavior: Behavior normal.        Thought Content: Thought content normal.        Judgment: Judgment normal.  Vitals reviewed.      Assessment/Plan: Encounter for annual routine gynecological examination  Cervical cancer screening - Plan: Cytology - PAP  Screening for HPV (human papillomavirus) - Plan: Cytology - PAP  Encounter for surveillance of contraceptive pills - Plan: norethindrone-ethinyl estradiol (JUNEL FE 1/20) 1-20 MG-MCG tablet; OCP RF.   Encounter for screening mammogram for malignant neoplasm of breast - Plan: MM 3D SCREEN BREAST BILATERAL  Family history of breast  cancer--MyRisk neg.   Increased risk of breast cancer--IBIS=32%. Cont monthly SBE, yearly CBE, Start yearly mammos this yr, also can do yearly scr breast MRI. Pt to f/u after mammo for ref prn. Add Vit D supp.   Pre-conception counseling--start PNVs before conception.  Need for immunization against influenza - Plan: Flu Vaccine QUAD 36+ mos IM   Meds ordered this encounter  Medications  . norethindrone-ethinyl estradiol (JUNEL FE 1/20) 1-20 MG-MCG tablet    Sig: Take 1 tablet by mouth  daily.    Dispense:  84 tablet    Refill:  3    Order Specific Question:   Supervising Provider    Answer:   Gae Dry [[id number]]    GYN counsel breast self exam, mammography screening, adequate intake of calcium and vitamin D, diet and exercise     F/U  Return in about 1 year (around 06/13/2021).  Mancil Pfenning B. Sophya Vanblarcom, PA-C 06/13/2020 4:18 PM

## 2020-06-13 NOTE — Patient Instructions (Addendum)
I value your feedback and entrusting us with your care. If you get a [facility] patient survey, I would appreciate you taking the time to let us know about your experience today. Thank you!  As of June 11, 2019, your lab results will be released to your MyChart immediately, before I even have a chance to see them. Please give me time to review them and contact you if there are any abnormalities. Thank you for your patience.   [facility]: [phone]  [facility]: [phone]  

## 2020-06-15 LAB — CYTOLOGY - PAP
Comment: NEGATIVE
Diagnosis: NEGATIVE
High risk HPV: NEGATIVE

## 2021-02-16 ENCOUNTER — Other Ambulatory Visit: Payer: Self-pay

## 2021-02-16 ENCOUNTER — Ambulatory Visit
Admission: RE | Admit: 2021-02-16 | Discharge: 2021-02-16 | Disposition: A | Discharge status: Home / Self Care | Payer: BC Managed Care – PPO | Source: Ambulatory Visit | Attending: Emergency Medicine | Admitting: Emergency Medicine

## 2021-02-16 VITALS — BP 122/84 | HR 84 | Temp 98.7°F | Resp 16

## 2021-02-16 DIAGNOSIS — M7652 Patellar tendinitis, left knee: Secondary | ICD-10-CM

## 2021-02-16 MED ORDER — PREDNISONE 10 MG (21) PO TBPK: ORAL_TABLET | ORAL | 0 refills | Status: DC

## 2021-02-16 NOTE — Discharge Instructions (Addendum)
Tomorrow morning start the prednisone and take it with breakfast.  Apply moist heat to your knee for 20 minutes at a time, 2-3 times a day.  Follow the rehab exercises in your discharge paperwork.  If your symptoms have not improved, or resolved, in 2 weeks follow up with Orthopaedics.

## 2021-02-16 NOTE — ED Provider Notes (Signed)
[facility]    CSN: [id number] Arrival date & time: 02/16/21  1451      History   Chief Complaint Chief Complaint  Patient presents with   Appointment    HPI Renee Lawrence is a 30 y.o. female.   HPI  30 year old female here for evaluation of left knee pain.  Patient reports that she has been experiencing left knee pain and swelling for the last 5 days.  This is not in the setting of any injury that she can think of at its not associated with numbness or tingling in her lower leg or foot.  It does not hurt to stand or walk but the pain does increase when she goes up and down stairs or when she bends her knee past 90 degrees.  She describes it as a constant dull ache.  She denies any previous injury to her knee.  She has recently increased her physical activity and walking.  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  Past Medical History:  Diagnosis Date   BRCA negative 04/2014   MyRisk neg   Family history of breast cancer    Genetic testing of female 04/2014   My Risk neg   History of Papanicolaou smear of cervix 02/09/2011; 04/30/2014   neg; neg   Increased risk of breast cancer    IBIS=32% 2015   Obesity     Patient Active Problem List   Diagnosis Date Noted   Epigastric pain 02/13/2018   Increased risk of breast cancer    Family history of breast cancer     Past Surgical History:  Procedure Laterality Date   TONSILLECTOMY      OB History     Gravida  0   Para  0   Term  0   Preterm  0   AB  0   Living  0      SAB  0   IAB  0   Ectopic  0   Multiple  0   Live Births               Home Medications    Prior to Admission medications   Medication Sig Start Date End Date Taking? Authorizing Provider  predniSONE (STERAPRED UNI-PAK 21 TAB) 10 MG (21) TBPK tablet Take 6 tablets on day 1, 5 tablets day 2, 4 tablets day 3, 3 tablets day 4, 2 tablets day 5, 1 tablet day 6 02/16/21  Yes Margarette Canada,  NP  norethindrone-ethinyl estradiol (JUNEL FE 1/20) 1-20 MG-MCG tablet Take 1 tablet by mouth daily. 50/93/26   Copland, Deirdre Evener, PA-C    Family History Family History  Problem Relation Age of Onset   Diabetes Sister    Breast cancer Maternal Grandmother 68       has contact   Breast cancer Paternal Grandmother 13       again at 49   Diabetes Brother        TYPE I - on insulin   Cancer Maternal Grandfather        lung   Breast cancer Paternal Aunt 45       has contact    Social History Social History   Tobacco Use   Smoking status: Never   Smokeless tobacco: Never  Vaping Use   Vaping Use: Never used  Substance Use Topics   Alcohol use: Yes    Comment: occas   Drug use: No  Allergies   Penicillins   Review of Systems Review of Systems  Constitutional:  Negative for activity change, appetite change and fever.  Musculoskeletal:  Positive for arthralgias and joint swelling. Negative for myalgias.  Skin:  Negative for color change and rash.  Neurological:  Negative for weakness and numbness.  Hematological: Negative.   Psychiatric/Behavioral: Negative.      Physical Exam Triage Vital Signs ED Triage Vitals  Enc Vitals Group     BP 02/16/21 1520 122/84     Pulse Rate 02/16/21 1518 84     Resp 02/16/21 1518 16     Temp 02/16/21 1518 98.7 F (37.1 C)     Temp Source 02/16/21 1518 Oral     SpO2 02/16/21 1518 96 %     Weight --      Height --      Head Circumference --      Peak Flow --      Pain Score 02/16/21 1516 3     Pain Loc --      Pain Edu? --      Excl. in [place]? --    No data found.  Updated Vital Signs BP 122/84   Pulse 84   Temp 98.7 F (37.1 C) (Oral)   Resp 16   LMP 01/17/2021   SpO2 96%   Visual Acuity Right Eye Distance:   Left Eye Distance:   Bilateral Distance:    Right Eye Near:   Left Eye Near:    Bilateral Near:     Physical Exam Vitals and nursing note reviewed.  Constitutional:      General: She is not in acute  distress.    Appearance: Normal appearance. She is not ill-appearing.  HENT:     Head: Normocephalic and atraumatic.  Musculoskeletal:        General: Swelling and tenderness present. No deformity or signs of injury. Normal range of motion.  Skin:    General: Skin is warm and dry.     Capillary Refill: Capillary refill takes less than 2 seconds.     Findings: No bruising or erythema.  Neurological:     General: No focal deficit present.     Mental Status: She is alert and oriented to person, place, and time.  Psychiatric:        Mood and Affect: Mood normal.        Behavior: Behavior normal.        Thought Content: Thought content normal.        Judgment: Judgment normal.     UC Treatments / Results  Labs (all labs ordered are listed, but only abnormal results are displayed) Labs Reviewed - No data to display  EKG   Radiology No results found.  Procedures Procedures (including critical care time)  Medications Ordered in UC Medications - No data to display  Initial Impression / Assessment and Plan / UC Course  I have reviewed the triage vital signs and the nursing notes.  Pertinent labs & imaging results that were available during my care of the patient were reviewed by me and considered in my medical decision making (see chart for details).  Patient very pleasant, nontoxic-appearing 30 year old female here for evaluation of left knee pain and swelling has been gone for 5 days and outside the setting of an injury.  Patient also denies any previous injury to her knee.  She has recently had increase in physical activity and has also been setting up her classroom as  she is a Pharmacist, hospital.  Patient's physical exam reveals a left knee that is a normal anatomical alignment.  Her tibial tuberosity is prominent but she states that this is normal for her.  Patient has tenderness with palpation of the patellar tendon inferiorly to the patella.  There is no tenderness with palpation of the  patella, medial or lateral joint line, quadriceps complex, popliteal fossa.  Patient states that she does not have any pain with varus stress application but she does with valgus strep application over top of the patellar tendon.  Patient also is complaining of pain when she gets to full extension and also to flexion past 90 degrees.  Patient's exam is consistent with patellar tendinitis.  She has been using over-the-counter Tylenol and NSAIDs along with ice with no relief.  We will transition patient to a prednisone Dosepak to start tomorrow morning.  Of also encouraged patient to use moist heat and to also follow the rehabilitation exercises given her discharge paperwork.   Final Clinical Impressions(s) / UC Diagnoses   Final diagnoses:  Patellar tendonitis of left knee     Discharge Instructions      Tomorrow morning start the prednisone and take it with breakfast.  Apply moist heat to your knee for 20 minutes at a time, 2-3 times a day.  Follow the rehab exercises in your discharge paperwork.  If your symptoms have not improved, or resolved, in 2 weeks follow up with Orthopaedics.      ED Prescriptions     Medication Sig Dispense Auth. Provider   predniSONE (STERAPRED UNI-PAK 21 TAB) 10 MG (21) TBPK tablet Take 6 tablets on day 1, 5 tablets day 2, 4 tablets day 3, 3 tablets day 4, 2 tablets day 5, 1 tablet day 6 21 tablet Margarette Canada, NP      PDMP not reviewed this encounter.   Margarette Canada, NP 02/16/21 [phone]

## 2021-02-16 NOTE — ED Triage Notes (Signed)
Left knee pain and swelling started Saturday. No known injury.

## 2021-06-22 ENCOUNTER — Ambulatory Visit (INDEPENDENT_AMBULATORY_CARE_PROVIDER_SITE_OTHER): Payer: BC Managed Care – PPO | Admitting: Advanced Practice Midwife

## 2021-06-22 ENCOUNTER — Ambulatory Visit: Payer: BC Managed Care – PPO | Admitting: Advanced Practice Midwife

## 2021-06-22 ENCOUNTER — Other Ambulatory Visit: Payer: Self-pay

## 2021-06-22 ENCOUNTER — Encounter: Payer: Self-pay | Admitting: Advanced Practice Midwife

## 2021-06-22 VITALS — BP 134/87 | Ht 70.0 in | Wt 264.0 lb

## 2021-06-22 DIAGNOSIS — Z9189 Other specified personal risk factors, not elsewhere classified: Secondary | ICD-10-CM | POA: Diagnosis not present

## 2021-06-22 DIAGNOSIS — Z0000 Encounter for general adult medical examination without abnormal findings: Secondary | ICD-10-CM | POA: Diagnosis not present

## 2021-06-22 DIAGNOSIS — N979 Female infertility, unspecified: Secondary | ICD-10-CM | POA: Diagnosis not present

## 2021-06-22 DIAGNOSIS — Z803 Family history of malignant neoplasm of breast: Secondary | ICD-10-CM | POA: Diagnosis not present

## 2021-06-22 MED ORDER — CLOMIPHENE CITRATE 50 MG PO TABS: ORAL_TABLET | ORAL | 0 refills | Status: DC

## 2021-06-22 NOTE — Progress Notes (Signed)
Gynecology Annual Exam   PCP: Patient, No Pcp Per (Inactive)  Chief Complaint:  Chief Complaint  Patient presents with   Annual Exam    History of Present Illness: Patient is a 30 y.o. G0P0000 presents for annual exam. The patient has complaint today of actively trying to conceive without success for the past year. She is wondering what the next steps are. She has tried ovulation predictor tests without success. She denies history of diabetes or Hirsutism. She is going to have plantar fascitis surgery in January. She is interested in hormone and PCOS testing today, and trying a course of Clomid after her surgery. She plans to schedule an MD appointment for further discussion/treatment and recommendations.  LMP: Patient's last menstrual period was 06/16/2021 (exact date). Average Interval: regular, every 4-5 weeks Duration of flow:  3-5  days Heavy Menses: sometimes Clots: no Intermenstrual Bleeding: no Postcoital Bleeding: no Dysmenorrhea: no  The patient is sexually active. She currently uses none for contraception. She denies dyspareunia.  The patient does perform self breast exams.  There is notable family history of breast or ovarian cancer in her family. She is BRCA negative. Mammogram was recommended beginning at age 63. She did not schedule and is now interested in scheduling.   The patient wears seatbelts: yes.   The patient has regular exercise:  she walks regularly. She admits healthy diet, hydration and sleep .    The patient denies current symptoms of depression.  She admits decreased stress level since switching from [facility] to [facility]. She is a Licensed conveyancer.  Review of Systems: Review of Systems  Constitutional:  Negative for chills and fever.  HENT:  Negative for congestion, ear discharge, ear pain, hearing loss, sinus pain and sore throat.   Eyes:  Negative for blurred vision and double vision.  Respiratory:  Negative for cough, shortness of  breath and wheezing.   Cardiovascular:  Negative for chest pain, palpitations and leg swelling.  Gastrointestinal:  Negative for abdominal pain, blood in stool, constipation, diarrhea, heartburn, melena, nausea and vomiting.  Genitourinary:  Negative for dysuria, flank pain, frequency, hematuria and urgency.  Musculoskeletal:  Negative for back pain, joint pain and myalgias.  Skin:  Negative for itching and rash.  Neurological:  Negative for dizziness, tingling, tremors, sensory change, speech change, focal weakness, seizures, loss of consciousness, weakness and headaches.  Endo/Heme/Allergies:  Negative for environmental allergies. Does not bruise/bleed easily.  Psychiatric/Behavioral:  Negative for depression, hallucinations, memory loss, substance abuse and suicidal ideas. The patient is not nervous/anxious and does not have insomnia.    Past Medical History:  Patient Active Problem List   Diagnosis Date Noted   Epigastric pain 02/13/2018   Increased risk of breast cancer     IBIS=32% 2015    Family history of breast cancer     Past Surgical History:  Past Surgical History:  Procedure Laterality Date   TONSILLECTOMY      Gynecologic History:  Patient's last menstrual period was 06/16/2021 (exact date). Contraception: none Last Pap: 1 year ago Results were: no abnormalities   Obstetric History: G0P0000  Family History:  Family History  Problem Relation Age of Onset   Diabetes Sister    Breast cancer Maternal Grandmother 71       has contact   Breast cancer Paternal Grandmother 21       again at 39   Diabetes Brother        TYPE I - on  insulin   Cancer Maternal Grandfather        lung   Breast cancer Paternal Aunt 98       has contact    Social History:  Social History   Socioeconomic History   Marital status: Single    Spouse name: Not on file   Number of children: 0   Years of education: 16   Highest education level: Not on file  Occupational History    Occupation: TEACHER  Tobacco Use   Smoking status: Never   Smokeless tobacco: Never  Vaping Use   Vaping Use: Never used  Substance and Sexual Activity   Alcohol use: Yes    Comment: occas   Drug use: No   Sexual activity: Yes    Birth control/protection: Pill  Other Topics Concern   Not on file  Social History Narrative   Not on file   Social Determinants of Health   Financial Resource Strain: Not on file  Food Insecurity: Not on file  Transportation Needs: Not on file  Physical Activity: Not on file  Stress: Not on file  Social Connections: Not on file  Intimate Partner Violence: Not on file    Allergies:  Allergies  Allergen Reactions   Penicillins     Medications: Prior to Admission medications   Medication Sig Start Date End Date Taking? Authorizing Provider  clomiPHENE (CLOMID) 50 MG tablet Take 1 tablet daily beginning on day 3, 4 or 5 of menstrual cycle for 5 days. Day 1 is the first day of bleeding. 06/22/21  Yes Rod Can, CNM    Physical Exam Vitals: Blood pressure 134/87, height 5' 10"  (1.778 m), weight 264 lb (119.7 kg), last menstrual period 06/19/2021.  General: NAD HEENT: normocephalic, anicteric Thyroid: no enlargement, no palpable nodules Pulmonary: No increased work of breathing, CTAB Cardiovascular: RRR, distal pulses 2+ Breast: Breast symmetrical, no tenderness, no palpable nodules or masses, no skin or nipple retraction present, no nipple discharge.  No axillary or supraclavicular lymphadenopathy. Abdomen: NABS, soft, non-tender, non-distended.  Umbilicus without lesions.  No hepatomegaly, splenomegaly or masses palpable. No evidence of hernia  Genitourinary: deferred for no concerns/PAP interval Extremities: no edema, erythema, or tenderness Neurologic: Grossly intact Psychiatric: mood appropriate, affect full   Assessment: 30 y.o. G0P0000 routine annual exam  Plan: Problem List Items Addressed This Visit       Other    Increased risk of breast cancer   Relevant Orders   MM 3D SCREEN BREAST BILATERAL   Family history of breast cancer   Relevant Orders   MM 3D SCREEN BREAST BILATERAL   Other Visit Diagnoses     Well woman exam without gynecological exam    -  Primary   Relevant Orders   TSH+Prl+FSH+TestT+LH+DHEA S...   Hgb A1c w/o eAG   Estradiol   Progesterone   Infertility, female       Relevant Medications   clomiPHENE (CLOMID) 50 MG tablet   Other Relevant Orders   TSH+Prl+FSH+TestT+LH+DHEA S...   Hgb A1c w/o eAG   Estradiol   Progesterone       1) STI screening  was offered and declined  2)  ASCCP guidelines and rationale discussed.  Patient opts for every 3 years screening interval  3) Contraception - the patient is currently using  none.  She is attempting to conceive in the near future  4) Routine healthcare maintenance including cholesterol, diabetes screening discuss:  PCOS panel, Estrogen, Progesterone, Hgb A1C ordered today  5)  Infertility: Clomid 1 course ordered to be taken after foot surgery. Follow up with MD as needed for further treatment  6) Return in about 1 year (around 06/22/2022) for annual established gyn.   Rod Can, [facility] 06/22/2021, 3:16 PM

## 2021-06-22 NOTE — Patient Instructions (Addendum)
Female Infertility Female infertility refers to a woman's inability to get pregnant (conceive) after a year of having sex regularly (or after 6 months in women over age 31) without using birth control. Infertility can also mean that a woman is not able to carry a pregnancy to full term. Both women and men can experience fertility problems. What are the causes? This condition may be caused by: Problems with reproductive organs. Infertility can result if a woman: Is not ovulating or is ovulating irregularly. Has a blockage or scarring in the fallopian tubes. Has uterine fibroids. This is a benign mass of tissue or muscle (tumor) that can develop in the uterus. Has an abnormally shaped uterus. Has an abnormally short cervix or a cervix that does not remain closed during a pregnancy. Certain medical conditions. These may include: Polycystic ovary syndrome (PCOS). This is a hormonal disorder that can cause small cysts to grow on the ovaries. This is the most common cause of infertility in women. Endometriosis. This is a condition in which the tissue that lines the uterus (endometrium) grows outside of its normal location. Cancer and cancer treatments, such as chemotherapy or radiation. Premature ovarian failure. This is when ovaries stop producing eggs and hormones before age 45. Sexually transmitted infections, such as chlamydia or gonorrhea. Autoimmune disorders. These are disorders in which the body's defense system (immune system) attacks normal, healthy cells. Infertility can be linked to more than one cause. For some women, the cause of infertility is not known (unexplained infertility). What increases the risk? The following factors may make you more likely to develop this condition: Age. A woman's fertility declines with age, especially after her mid-59s. Stress. Smoking. Being underweight or overweight. Drinking too much alcohol. Using drugs such as anabolic steroids, cocaine, and  marijuana. Exercising excessively. What are the signs or symptoms? The main sign of infertility in women is the inability to get pregnant or carry a pregnancy to full term. How is this diagnosed? This condition may be diagnosed by: Checking whether you are ovulating each month. The tests may include: Blood tests to check hormone levels. An ultrasound of the ovaries. Taking a sample of the tissue that lines the uterus and checking it under a microscope (endometrial biopsy). Doing additional tests. This is done if ovulation is normal. Tests may include: Hysterosalpingogram. This X-ray test can show the shape of the uterus and whether the fallopian tubes are open. Laparoscopy. This test uses a lighted tube (laparoscope) to look for problems in the fallopian tubes and other organs. Transvaginal ultrasound. This imaging test is used to check for abnormalities in the uterus and ovaries. Hysteroscopy. This test uses a lighted tube to check for problems in the cervix and the uterus. To be diagnosed with infertility, both partners will have a physical exam. Both partners will also have an extensive medical and sexual history taken. Additional tests may be done. How is this treated? Treatment depends on the cause of infertility. Most cases of infertility in women are treated with medicine or surgery. Women may take medicine to: Correct ovulation problems. Treat other health conditions. Surgery may be done to: Repair damage to the ovaries, fallopian tubes, cervix, or uterus. Remove growths from the uterus. Remove scar tissue from the uterus, pelvis, or other organs. Assisted reproductive technology (ART) Assisted reproductive technology (ART) refers to all treatments and procedures that combine eggs and sperm outside the body to try to help a couple conceive. ART is often combined with fertility drugs to stimulate ovulation.  Sometimes ART is done using eggs retrieved from another woman's body (donor  eggs) or from previously frozen fertilized eggs (embryos). There are different types of ART. These include: Intrauterine insemination (IUI). A long, thin tube is used to place sperm directly into a woman's uterus. This procedure: Is effective for infertility caused by sperm problems, including low sperm count and low motility. Can be used in combination with fertility drugs. In vitro fertilization (IVF). This is done when a woman's fallopian tubes are blocked or when a man has low sperm count. In this procedure: Fertility drugs are used to stimulate the ovaries to produce multiple eggs. Once mature, these eggs are removed from the body and combined with the sperm to be fertilized. The fertilized eggs are then placed into the woman's uterus. Follow these instructions at home: Lifestyle If you drink alcohol, limit how much you have to 0-1 drink a day. Do not use any products that contain nicotine or tobacco. These products include cigarettes, chewing tobacco, and vaping devices, such as e-cigarettes. If you need help quitting, ask your health care provider. Practice stress reduction techniques that work well for you, such as regular physical activity, meditation, or deep breathing. Make dietary changes to lose weight or maintain a healthy weight. Work with your health care provider and a dietitian to set a weight-loss goal that is healthy and reasonable for you. General instructions Take over-the-counter and prescription medicines only as told by your health care provider. Seek support from a counselor or support group to talk about your concerns related to infertility. Couples counseling may be helpful for you and your partner. Keep all follow-up visits. This is important. Where to find support Resolve - The National Infertility Association: resolve.org Contact a health care provider if: You feel that stress is interfering with your life and relationships. You have side effects from treatments  for infertility. Summary Female infertility refers to a woman's inability to get pregnant (conceive) after a year of having sex regularly (or after 6 months in women over age 49) without using birth control. To be diagnosed with infertility, both partners will have a physical exam. Both partners will also have an extensive medical and sexual history taken. Seek support from a counselor or support group to talk about your concerns related to infertility. Couples counseling may be helpful for you and your partner. This information is not intended to replace advice given to you by your health care provider. Make sure you discuss any questions you have with your health care provider. Document Revised: 08/16/2020 Document Reviewed: 08/16/2020 Elsevier Patient Education  [place].  Ovulation usually occurs between cycle days 14 and 19. Most fertility specialists recommend the use of an ovulation predictor kit to plan intercourse. The kit uses a urine sample to predict when ovulation is about to occur by measuring the [facility] level; these are available without a prescription in most pharmacies. Optimal timing of intercourse is on the day of the [facility] surge and the following day when ovulation occurs.  Polycystic Ovary Syndrome Polycystic ovarian syndrome (PCOS) is a common hormonal disorder among women of reproductive age. In most women with PCOS, small fluid-filled sacs (cysts) grow on the ovaries. PCOS can cause problems with menstrual periods and make it hard to get and stay pregnant. If this condition is not treated, it can lead to serious health problems, such as diabetes and heart disease. What are the causes? The cause of this condition is not known. It may be due to  certain factors, such as: Irregular menstrual cycle. High levels of certain hormones. Problems with the hormone that helps to control blood sugar (insulin). Certain genes. What increases the risk? You are more likely to develop  this condition if you: Have a family history of PCOS or type 2 diabetes. Are overweight, eat unhealthy foods, and are not active. These factors may cause problems with blood sugar control, which can contribute to PCOS or PCOS symptoms. What are the signs or symptoms? Symptoms of this condition include: Ovarian cysts and sometimes pelvic pain. Menstrual periods that are not regular or are too heavy. Inability to get or stay pregnant. Increased growth of hair on the face, chest, stomach, back, thumbs, thighs, or toes. Acne or oily skin. Acne may develop during adulthood, and it may not get better with treatment. Weight gain or obesity. Patches of thickened and dark brown or black skin on the neck, arms, breasts, or thighs. How is this diagnosed? This condition is diagnosed based on: Your medical history. A physical exam that includes a pelvic exam. Your health care provider may look for areas of increased hair growth on your skin. Tests, such as: An ultrasound to check the ovaries for cysts and to view the lining of the uterus. Blood tests to check levels of sugar (glucose), female hormone (testosterone), and female hormones (estrogen and progesterone). How is this treated? There is no cure for this condition, but treatment can help to manage symptoms and prevent more health problems from developing. Treatment varies depending on your symptoms and if you want to have a baby or if you need birth control. Treatment may include: Making nutrition and lifestyle changes. Taking the progesterone hormone to start a menstrual period. Taking birth control pills to help you have regular menstrual periods. Taking medicines such as: Medicines to make you ovulate, if you want to get pregnant. Medicine to reduce extra hair growth. Having surgery in severe cases. This may involve making small holes in one or both of your ovaries. This decreases the amount of testosterone that your body makes. Follow these  instructions at home: Take over-the-counter and prescription medicines only as told by your health care provider. Follow a healthy meal plan that includes lean proteins, complex carbohydrates, fresh fruits and vegetables, low-fat dairy products, healthy fats, and fiber. If you are overweight, lose weight as told by your health care provider. Your health care provider can determine how much weight loss is best for you and can help you lose weight safely. Keep all follow-up visits. This is important. Contact a health care provider if: Your symptoms do not get better with medicine. Your symptoms get worse or you develop new symptoms. Summary Polycystic ovarian syndrome (PCOS) is a common hormonal disorder among women of reproductive age. PCOS can cause problems with menstrual periods and make it hard to get and stay pregnant. If this condition is not treated, it can lead to serious health problems, such as diabetes and heart disease. There is no cure for this condition, but treatment can help to manage symptoms and prevent more health problems from developing. This information is not intended to replace advice given to you by your health care provider. Make sure you discuss any questions you have with your health care provider. Document Revised: 11/26/2019 Document Reviewed: 11/26/2019 Elsevier Patient Education  2022 [employer].

## 2021-06-27 LAB — TSH+PRL+FSH+TESTT+LH+DHEA S...
17-Hydroxyprogesterone: 17 ng/dL
Androstenedione: 71 ng/dL (ref 41–262)
DHEA-SO4: 206 ug/dL (ref 84.8–378.0)
FSH: 6.6 m[IU]/mL
LH: 5.4 m[IU]/mL
Prolactin: 11.6 ng/mL (ref 4.8–23.3)
TSH: 1.45 u[IU]/mL (ref 0.450–4.500)
Testosterone, Free: 3 pg/mL (ref 0.0–4.2)
Testosterone: 25 ng/dL (ref 8–60)

## 2021-06-27 LAB — HGB A1C W/O EAG: Hgb A1c MFr Bld: 5.5 % (ref 4.8–5.6)

## 2021-06-27 LAB — PROGESTERONE: Progesterone: 0.2 ng/mL

## 2021-06-27 LAB — ESTRADIOL: Estradiol: 46.5 pg/mL

## 2021-07-02 HISTORY — PX: FOOT SURGERY: SHX648

## 2021-08-14 ENCOUNTER — Inpatient Hospital Stay: Admission: RE | Admit: 2021-08-14 | Payer: BC Managed Care – PPO | Source: Ambulatory Visit

## 2021-09-09 ENCOUNTER — Ambulatory Visit
Admission: EM | Admit: 2021-09-09 | Discharge: 2021-09-09 | Disposition: A | Discharge status: Home / Self Care | Payer: BC Managed Care – PPO | Attending: Emergency Medicine | Admitting: Emergency Medicine

## 2021-09-09 ENCOUNTER — Other Ambulatory Visit: Payer: Self-pay

## 2021-09-09 ENCOUNTER — Ambulatory Visit (INDEPENDENT_AMBULATORY_CARE_PROVIDER_SITE_OTHER): Payer: BC Managed Care – PPO

## 2021-09-09 DIAGNOSIS — R509 Fever, unspecified: Secondary | ICD-10-CM | POA: Insufficient documentation

## 2021-09-09 DIAGNOSIS — R059 Cough, unspecified: Secondary | ICD-10-CM | POA: Insufficient documentation

## 2021-09-09 DIAGNOSIS — Z20822 Contact with and (suspected) exposure to covid-19: Secondary | ICD-10-CM | POA: Insufficient documentation

## 2021-09-09 DIAGNOSIS — J189 Pneumonia, unspecified organism: Secondary | ICD-10-CM | POA: Insufficient documentation

## 2021-09-09 DIAGNOSIS — R0602 Shortness of breath: Secondary | ICD-10-CM | POA: Diagnosis not present

## 2021-09-09 LAB — RESP PANEL BY RT-PCR (FLU A&B, COVID) ARPGX2
Influenza A by PCR: NEGATIVE
Influenza B by PCR: NEGATIVE
SARS Coronavirus 2 by RT PCR: NEGATIVE

## 2021-09-09 MED ORDER — ALBUTEROL SULFATE (2.5 MG/3ML) 0.083% IN NEBU
2.5000 mg | INHALATION_SOLUTION | Freq: Once | RESPIRATORY_TRACT | Status: AC
  Administered 2021-09-09: 2.5 mg via RESPIRATORY_TRACT

## 2021-09-09 MED ORDER — ALBUTEROL SULFATE HFA 108 (90 BASE) MCG/ACT IN AERS: 2.0000 | INHALATION_SPRAY | RESPIRATORY_TRACT | 0 refills | Status: DC | PRN

## 2021-09-09 MED ORDER — PREDNISONE 20 MG PO TABS: 60.0000 mg | ORAL_TABLET | Freq: Every day | ORAL | 0 refills | Status: AC

## 2021-09-09 MED ORDER — PROMETHAZINE-DM 6.25-15 MG/5ML PO SYRP
5.0000 mL | ORAL_SOLUTION | Freq: Four times a day (QID) | ORAL | 0 refills | Status: DC | PRN
Start: 2021-09-09 — End: 2021-10-25

## 2021-09-09 MED ORDER — BENZONATATE 100 MG PO CAPS: 200.0000 mg | ORAL_CAPSULE | Freq: Three times a day (TID) | ORAL | 0 refills | Status: DC

## 2021-09-09 MED ORDER — LEVOFLOXACIN 500 MG PO TABS: 500.0000 mg | ORAL_TABLET | Freq: Every day | ORAL | 0 refills | Status: DC

## 2021-09-09 MED ORDER — AEROCHAMBER MV MISC: 2 refills | Status: DC

## 2021-09-09 MED ORDER — IPRATROPIUM BROMIDE 0.06 % NA SOLN
2.0000 | Freq: Four times a day (QID) | NASAL | 12 refills | Status: DC
Start: 2021-09-09 — End: 2021-10-25

## 2021-09-09 NOTE — Discharge Instructions (Signed)
Take the Levaquin once daily for treatment of your community-acquired pneumonia. ? ?Take the prednisone 60 mg daily starting tomorrow for 5 days for help with pulmonary inflammation. ? ?Use the albuterol inhaler with a spacer, 2 puffs every 4-6 hours, as needed for shortness of breath or wheezing. ? ?Use the Tessalon Perles every 8 hours during the day as needed for cough.  Taken with a small sip of water.  These may give you numbness to the base of your tongue or metallic taste in mouth, this is normal. ? ?Use the Promethazine DM cough syrup at bedtime for cough and congestion as it would make you drowsy. ? ?Return for reevaluation if you have any new or worsening symptoms. ? ?Follow-up with your primary care provider in 4 to 6 weeks for a repeat chest x-ray to ensure resolution of your pneumonia.  ?

## 2021-09-09 NOTE — ED Triage Notes (Signed)
Pt here with C/O fever since Wednesday afternoon, cough chest congestion. Denies sore throat or other SX.  ?

## 2021-09-09 NOTE — ED Provider Notes (Signed)
°[facility] ° ° ° °CSN: [id number] °Arrival date & time: 09/09/21  1411 ° ° °  ° °History   °Chief Complaint °Chief Complaint  °Patient presents with  ° Fever  ° Cough  ° ° °HPI °Renee Lawrence is a 31 y.o. female.  ° °HPI ° °31-year-old female here for evaluation of respiratory complaints. ° °Patient reports that for last 3 days she has been experiencing cough, chest congestion, and fever.  Her Tmax has been 100.  She also endorses shortness of breath and wheezing.  Her cough is intermittently productive but for the most part she is not bringing up any mucus.  She denies runny nose nasal congestion, sore throat, or ear pain. ° °Past Medical History:  °Diagnosis Date  ° BRCA negative 04/2014  ° MyRisk neg  ° Family history of breast cancer   ° Genetic testing of female 04/2014  ° My Risk neg  ° History of Papanicolaou smear of cervix 02/09/2011; 04/30/2014  ° neg; neg  ° Increased risk of breast cancer   ° IBIS=32% 2015  ° Obesity   ° ° °Patient Active Problem List  ° Diagnosis Date Noted  ° Epigastric pain 02/13/2018  ° Increased risk of breast cancer   ° Family history of breast cancer   ° ° °Past Surgical History:  °Procedure Laterality Date  ° TONSILLECTOMY    ° ° °OB History   ° ° Gravida  °0  ° Para  °0  ° Term  °0  ° Preterm  °0  ° AB  °0  ° Living  °0  °  ° ° SAB  °0  ° IAB  °0  ° Ectopic  °0  ° Multiple  °0  ° Live Births  °   °   °  °  ° ° ° °Home Medications   ° °Prior to Admission medications   °Medication Sig Start Date End Date Taking? Authorizing Provider  °albuterol (VENTOLIN HFA) 108 (90 Base) MCG/ACT inhaler Inhale 2 puffs into the lungs every 4 (four) hours as needed. 09/09/21  Yes Ryan, Jeremy, NP  °benzonatate (TESSALON) 100 MG capsule Take 2 capsules (200 mg total) by mouth every 8 (eight) hours. 09/09/21  Yes Ryan, Jeremy, NP  °ipratropium (ATROVENT) 0.06 % nasal spray Place 2 sprays into both nostrils 4 (four) times daily. 09/09/21  Yes Ryan, Jeremy, NP  °levofloxacin (LEVAQUIN)  500 MG tablet Take 1 tablet (500 mg total) by mouth daily. 09/09/21  Yes Ryan, Jeremy, NP  °predniSONE (DELTASONE) 20 MG tablet Take 3 tablets (60 mg total) by mouth daily with breakfast for 5 days. 3 tablets daily for 5 days. 09/09/21 09/14/21 Yes Ryan, Jeremy, NP  °promethazine-dextromethorphan (PROMETHAZINE-DM) 6.25-15 MG/5ML syrup Take 5 mLs by mouth 4 (four) times daily as needed. 09/09/21  Yes Ryan, Jeremy, NP  °Spacer/Aero-Holding Chambers (AEROCHAMBER MV) inhaler Use as instructed 09/09/21  Yes Ryan, Jeremy, NP  ° ° °Family History °Family History  °Problem Relation Age of Onset  ° Diabetes Sister   ° Breast cancer Maternal Grandmother 50  °     has contact  ° Breast cancer Paternal Grandmother 50  °     again at 72  ° Diabetes Brother   °     TYPE I - on insulin  ° Cancer Maternal Grandfather   °     lung  ° Breast cancer Paternal Aunt 45  °     has contact  ° ° °Social History °Social   History  ° °Tobacco Use  ° Smoking status: Never  ° Smokeless tobacco: Never  °Vaping Use  ° Vaping Use: Never used  °Substance Use Topics  ° Alcohol use: Yes  °  Comment: occas  ° Drug use: No  ° ° ° °Allergies   °Penicillins ° ° °Review of Systems °Review of Systems  °Constitutional:  Positive for fever.  °HENT:  Negative for congestion, ear pain, rhinorrhea and sore throat.   °Respiratory:  Positive for cough, shortness of breath and wheezing.   °Hematological: Negative.   °Psychiatric/Behavioral: Negative.    ° ° °Physical Exam °Triage Vital Signs °ED Triage Vitals [09/09/21 1423]  °Enc Vitals Group  °   BP (!) 160/106  °   Pulse Rate (!) 120  °   Resp 18  °   Temp 100.3 °F (37.9 °C)  °   Temp Source Oral  °   SpO2 93 %  °   Weight 260 lb (117.9 kg)  °   Height 5' 10" (1.778 m)  °   Head Circumference   °   Peak Flow   °   Pain Score 0  °   Pain Loc   °   Pain Edu?   °   Excl. in GC?   ° °No data found. ° °Updated Vital Signs °BP (!) 160/106 (BP Location: Right Arm)    Pulse (!) 120    Temp 100.3 °F (37.9 °C) (Oral) Comment:  Pt took tylenol at 1pm   Resp 18    Ht 5' 10" (1.778 m)    Wt 260 lb (117.9 kg)    LMP 09/08/2021 Comment: Currently on period. Neg. home preg test 09/09/21   SpO2 93%    BMI 37.31 kg/m²  ° °Visual Acuity °Right Eye Distance:   °Left Eye Distance:   °Bilateral Distance:   ° °Right Eye Near:   °Left Eye Near:    °Bilateral Near:    ° °Physical Exam °Vitals and nursing note reviewed.  °Constitutional:   °   Appearance: Normal appearance. She is not ill-appearing.  °HENT:  °   Head: Normocephalic and atraumatic.  °   Right Ear: Tympanic membrane, ear canal and external ear normal. There is no impacted cerumen.  °   Left Ear: Tympanic membrane, ear canal and external ear normal. There is no impacted cerumen.  °   Nose: Congestion and rhinorrhea present.  °   Mouth/Throat:  °   Mouth: Mucous membranes are moist.  °   Pharynx: Oropharynx is clear. Posterior oropharyngeal erythema present.  °Cardiovascular:  °   Rate and Rhythm: Normal rate and regular rhythm.  °   Pulses: Normal pulses.  °   Heart sounds: Normal heart sounds. No murmur heard. °  No friction rub. No gallop.  °Pulmonary:  °   Effort: Pulmonary effort is normal.  °   Breath sounds: Wheezing and rales present. No rhonchi.  °Musculoskeletal:  °   Cervical back: Normal range of motion and neck supple.  °Lymphadenopathy:  °   Cervical: No cervical adenopathy.  °Skin: °   General: Skin is warm and dry.  °   Capillary Refill: Capillary refill takes less than 2 seconds.  °   Findings: No erythema or rash.  °Neurological:  °   General: No focal deficit present.  °   Mental Status: She is alert and oriented to person, place, and time.  °Psychiatric:     °     Mood and Affect: Mood normal.        Behavior: Behavior normal.        Thought Content: Thought content normal.     UC Treatments / Results  Labs (all labs ordered are listed, but only abnormal results are displayed) Labs Reviewed  RESP PANEL BY RT-PCR (FLU A&B, COVID) [id number]    EKG   Radiology DG  Chest 2 View  Result Date: 09/09/2021 CLINICAL DATA:  Cough, shortness of breath EXAM: CHEST - 2 VIEW COMPARISON:  None. FINDINGS: There is patchy alveolar infiltrate in the left parahilar region and left lower lung fields. Rest of the lung fields are clear. There is no pleural effusion or pneumothorax. IMPRESSION: There are patchy infiltrates in the left mid and left lower lung fields suggesting pneumonia, possibly in the left lower lobe. Electronically Signed   By: Elmer Picker M.D.   On: 09/09/2021 14:51    Procedures Procedures (including critical care time)  Medications Ordered in UC Medications  albuterol (PROVENTIL) (2.5 MG/3ML) 0.083% nebulizer solution 2.5 mg (2.5 mg Nebulization Given 09/09/21 1442)    Initial Impression / Assessment and Plan / UC Course  I have reviewed the triage vital signs and the nursing notes.  Pertinent labs & imaging results that were available during my care of the patient were reviewed by me and considered in my medical decision making (see chart for details).  Patient is a pleasant, nontoxic-appearing 31 year old female here for evaluation of respiratory complaints as outlined HPI above.  On exam patient has pearly-gray tympanic membranes bilaterally with normal light reflex and clear external auditory canals.  Nasal mucosa is erythematous and edematous with clear discharge in both nares.  Oropharyngeal exam reveals mild posterior oropharyngeal erythema with clear postnasal drip.  No cervical lymphadenopathy appreciated on exam.  Cardiopulmonary exam reveals diffuse wheezes and coarse rales in the left lower lobe posteriorly with a prolonged expiratory phase.  Concern patient has pneumonia and will obtain chest x-ray.  We will also give albuterol neb treatment to help with patient's shortness of breath and wheezing.  Chest x-ray independently reviewed and evaluated by me.  Impression: Patient has past infiltrates in the left lower and left upper lobe at  the lingula.  Right lung fields appear to clear.  Radiology overread is pending. Radiology findings state patchy alveolar infiltrate in the left perihilar region and left lower lung fields.  Rest of the lung fields are clear.  No pleural effusion or pneumothorax.  Patchy infiltrates suggestive of pneumonia, possibly in the left lower lobe.  I will order respiratory triplex panel to check for presence of COVID given the multilobar pneumonia.Marland Kitchen  Respiratory Plex panel is negative for COVID and influenza.  We will discharge patient home and treat her for community-acquired pneumonia.  Given that patient has an unspecified allergy to penicillins and is unclear if she can take cephalosporins I will treat her with Levaquin per up-to-date.  Loss of the patient albuterol inhaler, prednisone, Tessalon Perles, and Promethazine DM cough syrup.   Final Clinical Impressions(s) / UC Diagnoses   Final diagnoses:  Community acquired pneumonia of left lower lobe of lung     Discharge Instructions      Take the Levaquin once daily for treatment of your community-acquired pneumonia.  Take the prednisone 60 mg daily starting tomorrow for 5 days for help with pulmonary inflammation.  Use the albuterol inhaler with a spacer, 2 puffs every 4-6 hours, as needed for shortness of breath or wheezing.  Use the Tessalon Perles every 8 hours during the day as needed for cough.  Taken with a small sip of water.  These may give you numbness to the base of your tongue or metallic taste in mouth, this is normal.  Use the Promethazine DM cough syrup at bedtime for cough and congestion as it would make you drowsy.  Return for reevaluation if you have any new or worsening symptoms.  Follow-up with your primary care provider in 4 to 6 weeks for a repeat chest x-ray to ensure resolution of your pneumonia.      ED Prescriptions     Medication Sig Dispense Auth. Provider   albuterol (VENTOLIN HFA) 108 (90 Base)  MCG/ACT inhaler Inhale 2 puffs into the lungs every 4 (four) hours as needed. 18 g Margarette Canada, NP   Spacer/Aero-Holding Chambers (AEROCHAMBER MV) inhaler Use as instructed 1 each Margarette Canada, NP   benzonatate (TESSALON) 100 MG capsule Take 2 capsules (200 mg total) by mouth every 8 (eight) hours. 21 capsule Margarette Canada, NP   ipratropium (ATROVENT) 0.06 % nasal spray Place 2 sprays into both nostrils 4 (four) times daily. 15 mL Margarette Canada, NP   levofloxacin (LEVAQUIN) 500 MG tablet Take 1 tablet (500 mg total) by mouth daily. 7 tablet Margarette Canada, NP   predniSONE (DELTASONE) 20 MG tablet Take 3 tablets (60 mg total) by mouth daily with breakfast for 5 days. 3 tablets daily for 5 days. 15 tablet Margarette Canada, NP   promethazine-dextromethorphan (PROMETHAZINE-DM) 6.25-15 MG/5ML syrup Take 5 mLs by mouth 4 (four) times daily as needed. 118 mL Margarette Canada, NP      PDMP not reviewed this encounter.   Margarette Canada, NP 09/09/21 1546

## 2021-10-09 ENCOUNTER — Ambulatory Visit
Admission: RE | Admit: 2021-10-09 | Discharge: 2021-10-09 | Disposition: A | Discharge status: Home / Self Care | Payer: BC Managed Care – PPO | Source: Ambulatory Visit | Attending: Advanced Practice Midwife | Admitting: Advanced Practice Midwife

## 2021-10-09 DIAGNOSIS — Z1231 Encounter for screening mammogram for malignant neoplasm of breast: Secondary | ICD-10-CM | POA: Diagnosis not present

## 2021-10-09 DIAGNOSIS — Z9189 Other specified personal risk factors, not elsewhere classified: Secondary | ICD-10-CM | POA: Diagnosis present

## 2021-10-09 DIAGNOSIS — Z803 Family history of malignant neoplasm of breast: Secondary | ICD-10-CM | POA: Diagnosis present

## 2021-10-11 ENCOUNTER — Other Ambulatory Visit: Payer: Self-pay | Admitting: Advanced Practice Midwife

## 2021-10-11 DIAGNOSIS — R928 Other abnormal and inconclusive findings on diagnostic imaging of breast: Secondary | ICD-10-CM

## 2021-10-11 DIAGNOSIS — N6489 Other specified disorders of breast: Secondary | ICD-10-CM

## 2021-10-16 ENCOUNTER — Ambulatory Visit
Admission: RE | Admit: 2021-10-16 | Discharge: 2021-10-16 | Disposition: A | Discharge status: Home / Self Care | Payer: BC Managed Care – PPO | Source: Ambulatory Visit | Attending: Advanced Practice Midwife | Admitting: Advanced Practice Midwife

## 2021-10-16 DIAGNOSIS — R928 Other abnormal and inconclusive findings on diagnostic imaging of breast: Secondary | ICD-10-CM

## 2021-10-16 DIAGNOSIS — N6489 Other specified disorders of breast: Secondary | ICD-10-CM | POA: Insufficient documentation

## 2021-10-17 ENCOUNTER — Other Ambulatory Visit: Payer: Self-pay | Admitting: Advanced Practice Midwife

## 2021-10-17 DIAGNOSIS — R928 Other abnormal and inconclusive findings on diagnostic imaging of breast: Secondary | ICD-10-CM

## 2021-10-17 DIAGNOSIS — N63 Unspecified lump in unspecified breast: Secondary | ICD-10-CM

## 2021-10-23 ENCOUNTER — Other Ambulatory Visit: Payer: BC Managed Care – PPO

## 2021-10-24 ENCOUNTER — Other Ambulatory Visit: Payer: Self-pay | Admitting: Advanced Practice Midwife

## 2021-10-24 DIAGNOSIS — R928 Other abnormal and inconclusive findings on diagnostic imaging of breast: Secondary | ICD-10-CM

## 2021-10-24 DIAGNOSIS — N63 Unspecified lump in unspecified breast: Secondary | ICD-10-CM

## 2021-10-25 ENCOUNTER — Ambulatory Visit (INDEPENDENT_AMBULATORY_CARE_PROVIDER_SITE_OTHER): Payer: BC Managed Care – PPO

## 2021-10-25 DIAGNOSIS — Z3400 Encounter for supervision of normal first pregnancy, unspecified trimester: Secondary | ICD-10-CM

## 2021-10-25 DIAGNOSIS — Z3A00 Weeks of gestation of pregnancy not specified: Secondary | ICD-10-CM

## 2021-10-25 DIAGNOSIS — Z3480 Encounter for supervision of other normal pregnancy, unspecified trimester: Secondary | ICD-10-CM

## 2021-10-25 DIAGNOSIS — Z3490 Encounter for supervision of normal pregnancy, unspecified, unspecified trimester: Secondary | ICD-10-CM | POA: Insufficient documentation

## 2021-10-25 NOTE — Progress Notes (Signed)
New OB Intake  I connected with  Renee Lawrence on 10/25/21 at  3:15 PM EDT by telephone Telephone Visit and verified that I am speaking with the correct person using two identifiers. Nurse is located at [facility] and pt is located in her classroom by herself.  I explained I am completing New OB Intake today. We discussed her EDD of 06/15/2022 that is based on LMP of 09/08/2021. Pt is G1/P0. I reviewed her allergies, medications, Medical/Surgical/OB history, and appropriate screenings. Based on history, this is a/an  pregnancy uncomplicated .   Patient Active Problem List   Diagnosis Date Noted   Epigastric pain 02/13/2018   Increased risk of breast cancer    Family history of breast cancer     Concerns addressed today Pt is scheduled for a breast biopsy next week.  Adv local anesthesia was fine.  Delivery Plans:  Plans to deliver at [facility] L[facility].    [place] Explained first scheduled [place] will be scheduled at her first visit with provider - no.  Labs Pt aware NOB labs will be drawn at first visit with provider - no.  Pt aware the GC/Chlm cultures will be taken as they are required by the state.  Patient has covid vaccine.    Social Determinants of Health Food Insecurity: Patient denies food insecurity.  Transportation: Patient denies transportation needs.  Placed OB Box on problem list and updated  First visit review I reviewed new OB appt with pt. I explained she will have a pelvic exam, ob bloodwork with genetic screening, and PAP smear. Explained pt will be seen by Paula Compton, CNM at first visit; encounter routed to appropriate provider.    Loran Senters, [facility]) 10/25/2021  3:18 PM

## 2021-11-01 ENCOUNTER — Ambulatory Visit
Admission: RE | Admit: 2021-11-01 | Discharge: 2021-11-01 | Disposition: A | Discharge status: Home / Self Care | Payer: BC Managed Care – PPO | Source: Ambulatory Visit | Attending: Advanced Practice Midwife | Admitting: Advanced Practice Midwife

## 2021-11-01 DIAGNOSIS — R928 Other abnormal and inconclusive findings on diagnostic imaging of breast: Secondary | ICD-10-CM

## 2021-11-01 DIAGNOSIS — N63 Unspecified lump in unspecified breast: Secondary | ICD-10-CM

## 2021-11-06 ENCOUNTER — Other Ambulatory Visit: Payer: BC Managed Care – PPO

## 2021-11-08 ENCOUNTER — Other Ambulatory Visit (HOSPITAL_COMMUNITY)
Admission: RE | Admit: 2021-11-08 | Discharge: 2021-11-08 | Disposition: A | Discharge status: Home / Self Care | Payer: BC Managed Care – PPO | Source: Ambulatory Visit | Attending: Obstetrics | Admitting: Obstetrics

## 2021-11-08 ENCOUNTER — Ambulatory Visit (INDEPENDENT_AMBULATORY_CARE_PROVIDER_SITE_OTHER): Payer: BC Managed Care – PPO | Admitting: Obstetrics

## 2021-11-08 VITALS — BP 124/80 | Wt 266.0 lb

## 2021-11-08 DIAGNOSIS — N926 Irregular menstruation, unspecified: Secondary | ICD-10-CM

## 2021-11-08 DIAGNOSIS — Z124 Encounter for screening for malignant neoplasm of cervix: Secondary | ICD-10-CM | POA: Diagnosis not present

## 2021-11-08 DIAGNOSIS — Z113 Encounter for screening for infections with a predominantly sexual mode of transmission: Secondary | ICD-10-CM | POA: Insufficient documentation

## 2021-11-08 DIAGNOSIS — Z3480 Encounter for supervision of other normal pregnancy, unspecified trimester: Secondary | ICD-10-CM

## 2021-11-08 DIAGNOSIS — Z3400 Encounter for supervision of normal first pregnancy, unspecified trimester: Secondary | ICD-10-CM

## 2021-11-08 DIAGNOSIS — Z3401 Encounter for supervision of normal first pregnancy, first trimester: Secondary | ICD-10-CM | POA: Diagnosis not present

## 2021-11-08 LAB — POCT URINE PREGNANCY: Preg Test, Ur: POSITIVE — AB

## 2021-11-08 NOTE — Progress Notes (Signed)
? ? ? ? ? ?New Obstetric Patient H&P  ? ? ?Chief Complaint: "Desires prenatal care" ? ? ?History of Present Illness: Patient is a 31 y.o. G1P0000 Not Hispanic or Latino female, LMP 09/08/2021 presents with amenorrhea and positive home pregnancy test. Based on her  LMP, her EDD is Estimated Date of Delivery: 06/15/22 and her EGA is [redacted]w[redacted]d Cycles are 5. days, regular, and occur approximately every : 28 days. Her last pap smear was about 1 years ago and was no abnormalities.  ?  ?She had a urine pregnancy test which was positive about 3 week(s)  ago. Her last menstrual period was normal and lasted for  about 5 day(s). Since her LMP she claims she has experienced fatigue, slight nausea. She denies vaginal bleeding. Her past medical history is noncontributory. Her prior pregnancies are notable for none ? ?Since her LMP, she admits to the use of tobacco products  no ?She claims she has gained   no pounds since the start of her pregnancy.  ?There are cats in the home in the home  no  ?She admits close contact with children on a regular basis  yes  ?She has had chicken pox in the past yes ?She has had Tuberculosis exposures, symptoms, or previously tested positive for TB   no ?Current or past history of domestic violence. no ? ?Genetic Screening/Teratology Counseling: (Includes patient, baby's father, or anyone in either family with:)  ? ?1. [facility] ?2. Thalassemia ([place] or Asian background): MCV<80  no ?3. Neural tube defect (meningomyelocele, spina bifida, anencephaly)  no ?4. Congenital heart defect  no  ?5. Down syndrome  no ?6. Tay-Sachs (Jewish, [place]  no ?7. Canavan's Disease  no ?8. Sickle cell disease or trait (African)  no  ?9. Hemophilia or other blood disorders  no  ?10. Muscular dystrophy  no  ?11. Cystic fibrosis  no  ?12. Huntington's Chorea  no  ?13. Mental retardation/autism  no ?14. Other inherited genetic or chromosomal disorder  no ?15. Maternal  metabolic disorder (DM, PKU, etc)  no ?16. Patient or FOB with a child with a birth defect not listed above no  ?16a. Patient or FOB with a birth defect themselves no ?17. Recurrent pregnancy loss, or stillbirth  no  ?18. Any medications since LMP other than prenatal vitamins (include vitamins, supplements, OTC meds, drugs, alcohol)  no ?19. Any other genetic/environmental exposure to discuss  no ? ?Infection History:  ? ?1. Lives with someone with TB or TB exposed  no  ?2. Patient or partner has history of genital herpes  no ?3. Rash or viral illness since LMP  no ?4. History of STI (GC, CT, HPV, syphilis, HIV)  no ?5. History of recent travel :  no ? ?Other pertinent information:  no  ? ? ? ?Review of Systems:10 point review of systems negative unless otherwise noted in HPI ? ?Past Medical History:  ?Past Medical History:  ?Diagnosis Date  ?? BRCA negative 04/2014  ? MyRisk neg  ?? Family history of breast cancer   ?? Genetic testing of female 04/2014  ? My Risk neg  ?? History of Papanicolaou smear of cervix 02/09/2011; 04/30/2014  ? neg; neg  ?? Increased risk of breast cancer   ? IBIS=32% 2015  ?? Obesity   ? ? ?Past Surgical History:  ?Past Surgical History:  ?Procedure Laterality Date  ?? FOOT SURGERY Left 07/2021  ? Dr AAlta Corning ??  TONSILLECTOMY    ? ? ?Gynecologic History: Patient's last menstrual period was 09/08/2021 (exact date). ? ?Obstetric History: G1P0000 ? ?Family History:  ?Family History  ?Problem Relation Age of Onset  ?? Diabetes Sister   ?? Breast cancer Paternal Aunt 92  ?     has contact  ?? Breast cancer Maternal Grandmother 31  ?     has contact  ?? Cancer Maternal Grandfather   ?     lung  ?? Breast cancer Paternal Grandmother 34  ?     again at 73  ?? Diabetes Brother   ?     TYPE I - on insulin  ? ? ?Social History:  ?Social History  ? ?Socioeconomic History  ?? Marital status: Married  ?  Spouse name: Not on file  ?? Number of children: 0  ?? Years of education: 95  ?? Highest  education level: Not on file  ?Occupational History  ?? Occupation: TEACHER  ?Tobacco Use  ?? Smoking status: Never  ?? Smokeless tobacco: Never  ?Vaping Use  ?? Vaping Use: Never used  ?Substance and Sexual Activity  ?? Alcohol use: Not Currently  ?  Comment: occas  ?? Drug use: No  ?? Sexual activity: Yes  ?  Birth control/protection: None  ?Other Topics Concern  ?? Not on file  ?Social History Narrative  ?? Not on file  ? ?Social Determinants of Health  ? ?Financial Resource Strain: Low Risk   ?? Difficulty of Paying Living Expenses: Not hard at all  ?Food Insecurity: No Food Insecurity  ?? Worried About Charity fundraiser in the Last Year: Never true  ?? Ran Out of Food in the Last Year: Never true  ?Transportation Needs: No Transportation Needs  ?? Lack of Transportation (Medical): No  ?? Lack of Transportation (Non-Medical): No  ?Physical Activity: Insufficiently Active  ?? Days of Exercise per Week: 3 days  ?? Minutes of Exercise per Session: 30 min  ?Stress: No Stress Concern Present  ?? Feeling of Stress : Not at all  ?Social Connections: Moderately Integrated  ?? Frequency of Communication with Friends and Family: More than three times a week  ?? Frequency of Social Gatherings with Friends and Family: Three times a week  ?? Attends Religious Services: More than 4 times per year  ?? Active Member of Clubs or Organizations: No  ?? Attends Archivist Meetings: Never  ?? Marital Status: Married  ?Intimate Partner Violence: Not At Risk  ?? Fear of Current or Ex-Partner: No  ?? Emotionally Abused: No  ?? Physically Abused: No  ?? Sexually Abused: No  ? ? ?Allergies:  ?Allergies  ?Allergen Reactions  ?? Penicillins   ? ? ?Medications: ?Prior to Admission medications   ?Medication Sig Start Date End Date Taking? Authorizing Provider  ?Prenatal Vit-Fe Fumarate-FA (MULTIVITAMIN-PRENATAL) 27-0.8 MG TABS tablet Take 1 tablet by mouth daily at 12 noon.    [provider]  ? ? ?Physical Exam ?Vitals:  Blood pressure 124/80, weight 266 lb (120.7 kg), last menstrual period 09/08/2021. ? ?General: NAD ?HEENT: normocephalic, anicteric ?Thyroid: no enlargement, no palpable nodules ?Pulmonary: No increased work of breathing, CTAB ?Cardiovascular: RRR, distal pulses 2+ ?Abdomen: NABS, soft, non-tender, non-distended.  Umbilicus without lesions.  No hepatomegaly, splenomegaly or masses palpable. No evidence of hernia  ?Genitourinary: ? External: Normal external female genitalia.  Normal urethral meatus, normal  Bartholin's and Skene's glands.   ? Vagina: Normal vaginal mucosa, no evidence of prolapse.   ?  Cervix: Grossly normal in appearance, no bleeding ? Uterus: anteverted, Non-enlarged, mobile, normal contour.  No CMT ? Adnexa: ovaries non-enlarged, no adnexal masses ? Rectal: deferred ?Extremities: no edema, erythema, or tenderness ?Neurologic: Grossly intact ?Psychiatric: mood appropriate, affect full ? ? ?Assessment: 31 y.o. G1P0000 at 77w5dpresenting to initiate prenatal care ? ?Plan: ?1) Avoid alcoholic beverages. ?2) Patient encouraged not to smoke.  ?3) Discontinue the use of all non-medicinal drugs and chemicals.  ?4) Take prenatal vitamins daily.  ?5) Nutrition, food safety (fish, cheese advisories, and high nitrite foods) and exercise discussed. ?6) Hospital and practice style discussed with cross coverage system.  ?7) Genetic Screening, such as with 1st Trimester Screening, cell free fetal DNA, AFP testing, and Ultrasound, as well as with amniocentesis and CVS as appropriate, is discussed with patient. At the conclusion of today's visit patient undecided genetic testing ?8) Patient is asked about travel to areas at risk for the Zika virus, and counseled to avoid travel and exposure to mosquitoes or sexual partners who may have themselves been exposed to the virus. Testing is discussed, and will be ordered as appropriate.  ? ?I reviewed the weight gain guidelines. ?Encouraged her to walk daily, and take a  prenatal class. ?She is returning tomorrow for a dating scan ? RTC in 4 weeks for blood work. ?Pap and cultures done today. ?The following were addressed during this visit: ? ?Breastfeeding Education ?-Hedy Camara

## 2021-11-08 NOTE — Progress Notes (Signed)
NOB today. Had NOB intake via phone 4/26. LMP 09/08/21 ?

## 2021-11-09 ENCOUNTER — Ambulatory Visit (INDEPENDENT_AMBULATORY_CARE_PROVIDER_SITE_OTHER): Payer: BC Managed Care – PPO

## 2021-11-09 DIAGNOSIS — Z3400 Encounter for supervision of normal first pregnancy, unspecified trimester: Secondary | ICD-10-CM

## 2021-11-09 DIAGNOSIS — N926 Irregular menstruation, unspecified: Secondary | ICD-10-CM | POA: Diagnosis not present

## 2021-11-10 LAB — CERVICOVAGINAL ANCILLARY ONLY
Bacterial Vaginitis (gardnerella): NEGATIVE
Candida Glabrata: NEGATIVE
Candida Vaginitis: NEGATIVE
Chlamydia: NEGATIVE
Comment: NEGATIVE
Comment: NEGATIVE
Comment: NEGATIVE
Comment: NEGATIVE
Comment: NEGATIVE
Comment: NORMAL
Neisseria Gonorrhea: NEGATIVE
Trichomonas: NEGATIVE

## 2021-11-10 LAB — CYTOLOGY - PAP: Diagnosis: NEGATIVE

## 2021-11-28 ENCOUNTER — Encounter: Payer: Self-pay | Admitting: Obstetrics

## 2021-12-04 ENCOUNTER — Telehealth: Payer: Self-pay

## 2021-12-04 MED ORDER — DOXYLAMINE-PYRIDOXINE 10-10 MG PO TBEC: 2.0000 | DELAYED_RELEASE_TABLET | Freq: Every day | ORAL | 5 refills | Status: DC

## 2021-12-04 NOTE — Telephone Encounter (Signed)
Pt sent message nothing helping with nausea, shes miserable. Sent in Diclegis, pt aware

## 2021-12-06 ENCOUNTER — Ambulatory Visit (INDEPENDENT_AMBULATORY_CARE_PROVIDER_SITE_OTHER): Payer: BC Managed Care – PPO | Admitting: Obstetrics

## 2021-12-06 ENCOUNTER — Encounter: Payer: Self-pay | Admitting: Obstetrics

## 2021-12-06 ENCOUNTER — Encounter: Payer: BC Managed Care – PPO | Admitting: Licensed Practical Nurse

## 2021-12-06 VITALS — BP 120/90 | Wt 262.6 lb

## 2021-12-06 DIAGNOSIS — O99210 Obesity complicating pregnancy, unspecified trimester: Secondary | ICD-10-CM

## 2021-12-06 DIAGNOSIS — Z113 Encounter for screening for infections with a predominantly sexual mode of transmission: Secondary | ICD-10-CM

## 2021-12-06 DIAGNOSIS — Z3A09 9 weeks gestation of pregnancy: Secondary | ICD-10-CM

## 2021-12-06 DIAGNOSIS — O169 Unspecified maternal hypertension, unspecified trimester: Secondary | ICD-10-CM

## 2021-12-06 DIAGNOSIS — Z3400 Encounter for supervision of normal first pregnancy, unspecified trimester: Secondary | ICD-10-CM

## 2021-12-06 DIAGNOSIS — O219 Vomiting of pregnancy, unspecified: Secondary | ICD-10-CM

## 2021-12-06 NOTE — Progress Notes (Signed)
Patient returns for pregnancy follow-up. Patient has no concerns.  Patient reports no fetal movement yet, and denies vaginal bleeding or discharge.  Patient denies cramping or urinary symptoms. Patient denies nausea or vomiting. had headache for 3 days in a row to 6-7/10.   BP 120/90   Wt 262 lb 9.6 oz (119.1 kg)   LMP 09/08/2021 (Exact Date)   BMI 37.68 kg/m  Physical Exam Vitals and nursing note reviewed.  Constitutional:      Appearance: Normal appearance.  HENT:     Head: Normocephalic and atraumatic.  Eyes:     Extraocular Movements: Extraocular movements intact.  Pulmonary:     Effort: Pulmonary effort is normal.  Musculoskeletal:        General: Normal range of motion.     Cervical back: Normal range of motion.  Skin:    General: Skin is warm and dry.  Neurological:     General: No focal deficit present.     Mental Status: She is alert and oriented to person, place, and time.  Psychiatric:        Mood and Affect: Mood normal.        Behavior: Behavior normal.        Thought Content: Thought content normal.   Assessment and Plan:  [name] is an 31 y.o. year old G1P0000 at 2w4dwith [place] Estimated Date of Delivery: 07/07/22 with Supervision of normal first pregnancy, antepartum - Plan: Comp Met (CMET), TSH Rfx on Abnormal to Free T4, HgB A1c, Protein / creatinine ratio, urine, Urine Culture, Urine Drug Panel 7, Pregnancy, Initial Screen, CANCELED: Pregnancy, Initial Screen  [redacted] weeks gestation of pregnancy - Plan: Pregnancy, Initial Screen  Obesity affecting pregnancy, antepartum - Plan: Comp Met (CMET), TSH Rfx on Abnormal to Free T4, HgB A1c  Nausea and vomiting in pregnancy  Elevated blood pressure affecting pregnancy, antepartum  Routine screening for STI (sexually transmitted infection) - Plan: Pregnancy, Initial Screen  Labs:NOB labs today Genetics -  declined Immunizations: s/p COVID. Recommend tdap 28 wks  NVP - s/p rx diclegis. Down 3  lbs, improved on meds.  Elevated BP today - rpt. 132/94. Pt encouraged to take her obtain BP cuff and check BP daily and send them on mychart.  ASA 81 at 12 wks BMI 38: Due to obesity, additional counseling was given. Discussed with patient that obesity increases the risk of birth defects including heart defects and neural tube defects.  Infants have increased risk of macrosomia which increases risk of injury during birth. Will plan to monitor growth throughout antepartum course w/ serial ultrasounds. Mothers who are obese have an increased risk of preterm delivery, dysfunctional labor which increases cesarean delivery, and pregnancy loss/ stillbirth--the risk of stillbirth increases with BMI. This is multifactorial and associated with placental dysfunction. Obesity during pregnancy increases the risk of gestational hypertension and preeclampsia which can lead to fetal and maternal death in very severe cases.  There is an increased risk of gestational diabetes, and women who have had gestational diabetes have a higher risk of having diabetes in the future, as do their children. There is also increased risk of endocrinopathies like hypothyroidism, anesthetic complications related to sleep apnea, psychiatric disease including depression, and increased risk of thromboembolic disease. Patient denies snoring, excessive daytime sleepiness, or witnessed apnea. Discussed the importance of weight management with TWG 11-20 lbs and regular prenatal visits. Use of low-dose aspirin is recommended beginning in late first trimester for any of the following  risk factors:  prior preeclampsia, multiple gestation, chronic hypertension, diabetes, renal disease, autoimmune disease, prior pregnancy with IUGR;  or more than one of the following: nulliparity, obesity, sociodemographic risk factors, AMA, > 10 years since last pregnancy, prior adverse pregnancy outcome, family history of preeclapmsia, maternal history of being born SGA  or IUGR. Will plan to check CBC, CMP, TSH, urine protein creatinine ratio at baseline. In place of early hour will do a1c today as lab will soon close.  AS for antenatal surveillance:  Prepregnancy BMI of 35-39.9: plan Anatomy ultrasound with MFM, growth scan at 32 and 36 weeks, weekly modified BPP at 36 weeks  Return in about 4 weeks (around 01/03/2022) for routine OB.

## 2021-12-06 NOTE — Patient Instructions (Signed)
Please call if you have contractions or excessive cramping pains, urinary symptoms, if you have vaginal bleeding, or any other any concerns. If you are not yet signed up on MyChart, please ask us how to sign up for it!  

## 2021-12-06 NOTE — Progress Notes (Signed)
Second blood pressure taken in office by CMA 132/94

## 2021-12-06 NOTE — Addendum Note (Signed)
Addended by: Meryl Dare on: 12/06/2021 04:48 PM   Modules accepted: Orders

## 2021-12-06 NOTE — Progress Notes (Signed)
BP recheck 136/94.

## 2021-12-07 ENCOUNTER — Encounter: Payer: Self-pay | Admitting: Obstetrics

## 2021-12-07 DIAGNOSIS — O09899 Supervision of other high risk pregnancies, unspecified trimester: Secondary | ICD-10-CM | POA: Insufficient documentation

## 2021-12-07 DIAGNOSIS — Z2839 Other underimmunization status: Secondary | ICD-10-CM | POA: Insufficient documentation

## 2021-12-07 LAB — COMPREHENSIVE METABOLIC PANEL
ALT: 12 IU/L (ref 0–32)
AST: 15 IU/L (ref 0–40)
Albumin/Globulin Ratio: 1.5 (ref 1.2–2.2)
Albumin: 4.1 g/dL (ref 3.8–4.8)
Alkaline Phosphatase: 74 IU/L (ref 44–121)
BUN/Creatinine Ratio: 8 — ABNORMAL LOW (ref 9–23)
BUN: 5 mg/dL — ABNORMAL LOW (ref 6–20)
Bilirubin Total: <0.2 mg/dL (ref 0.0–1.2)
CO2: 21 mmol/L (ref 20–29)
Calcium: 9.6 mg/dL (ref 8.7–10.2)
Chloride: 101 mmol/L (ref 96–106)
Creatinine, Ser: 0.65 mg/dL (ref 0.57–1.00)
Globulin, Total: 2.8 g/dL (ref 1.5–4.5)
Glucose: 83 mg/dL (ref 70–99)
Potassium: 4.3 mmol/L (ref 3.5–5.2)
Sodium: 137 mmol/L (ref 134–144)
Total Protein: 6.9 g/dL (ref 6.0–8.5)
eGFR: 121 mL/min/{1.73_m2} (ref >59)

## 2021-12-07 LAB — HEMOGLOBIN A1C
Est. average glucose Bld gHb Est-mCnc: 105 mg/dL
Hgb A1c MFr Bld: 5.3 % (ref 4.8–5.6)

## 2021-12-07 LAB — CBC/D/PLT+RPR+RH+ABO+RUBIGG...
Antibody Screen: NEGATIVE
Basophils Absolute: 0.1 10*3/uL (ref 0.0–0.2)
Basos: 0 %
EOS (ABSOLUTE): 0.2 10*3/uL (ref 0.0–0.4)
Eos: 2 %
HCV Ab: NONREACTIVE
HIV Screen 4th Generation wRfx: NONREACTIVE
Hematocrit: 38.3 % (ref 34.0–46.6)
Hemoglobin: 13.4 g/dL (ref 11.1–15.9)
Hepatitis B Surface Ag: NEGATIVE
Immature Grans (Abs): 0 10*3/uL (ref 0.0–0.1)
Immature Granulocytes: 0 %
Lymphocytes Absolute: 4.1 10*3/uL — ABNORMAL HIGH (ref 0.7–3.1)
Lymphs: 31 %
MCH: 29.8 pg (ref 26.6–33.0)
MCHC: 35 g/dL (ref 31.5–35.7)
MCV: 85 fL (ref 79–97)
Monocytes Absolute: 0.8 10*3/uL (ref 0.1–0.9)
Monocytes: 6 %
Neutrophils Absolute: 7.8 10*3/uL — ABNORMAL HIGH (ref 1.4–7.0)
Neutrophils: 61 %
Platelets: 329 10*3/uL (ref 150–450)
RBC: 4.5 x10E6/uL (ref 3.77–5.28)
RDW: 12.3 % (ref 11.7–15.4)
RPR Ser Ql: NONREACTIVE
Rh Factor: POSITIVE
Rubella Antibodies, IGG: <0.9 index — ABNORMAL LOW (ref >0.99)
Varicella zoster IgG: 216 index (ref >165)
WBC: 13 10*3/uL — ABNORMAL HIGH (ref 3.4–10.8)

## 2021-12-07 LAB — PROTEIN / CREATININE RATIO, URINE
Creatinine, Urine: 182 mg/dL
Protein, Ur: 9.8 mg/dL
Protein/Creat Ratio: 54 mg/g creat (ref 0–200)

## 2021-12-07 LAB — TSH RFX ON ABNORMAL TO FREE T4: TSH: 1.14 u[IU]/mL (ref 0.450–4.500)

## 2021-12-07 LAB — HCV INTERPRETATION

## 2021-12-08 LAB — URINE DRUG PANEL 7
Amphetamines, Urine: NEGATIVE ng/mL
Barbiturate Quant, Ur: NEGATIVE ng/mL
Benzodiazepine Quant, Ur: NEGATIVE ng/mL
Cannabinoid Quant, Ur: NEGATIVE ng/mL
Cocaine (Metab.): NEGATIVE ng/mL
Opiate Quant, Ur: NEGATIVE ng/mL
PCP Quant, Ur: NEGATIVE ng/mL

## 2021-12-08 LAB — URINE CULTURE

## 2021-12-08 NOTE — Progress Notes (Signed)
Pt aware of results 

## 2021-12-08 NOTE — Progress Notes (Signed)
Pt aware of results, reports no uti symptoms.

## 2021-12-26 ENCOUNTER — Encounter: Payer: Self-pay | Admitting: Obstetrics

## 2022-01-05 ENCOUNTER — Ambulatory Visit (INDEPENDENT_AMBULATORY_CARE_PROVIDER_SITE_OTHER): Payer: BC Managed Care – PPO | Admitting: Advanced Practice Midwife

## 2022-01-05 ENCOUNTER — Encounter: Payer: Self-pay | Admitting: Advanced Practice Midwife

## 2022-01-05 VITALS — BP 122/80 | Wt 260.0 lb

## 2022-01-05 DIAGNOSIS — Z3A13 13 weeks gestation of pregnancy: Secondary | ICD-10-CM

## 2022-01-05 DIAGNOSIS — Z369 Encounter for antenatal screening, unspecified: Secondary | ICD-10-CM

## 2022-01-05 DIAGNOSIS — O99210 Obesity complicating pregnancy, unspecified trimester: Secondary | ICD-10-CM | POA: Insufficient documentation

## 2022-01-05 DIAGNOSIS — Z3482 Encounter for supervision of other normal pregnancy, second trimester: Secondary | ICD-10-CM

## 2022-01-05 DIAGNOSIS — O99212 Obesity complicating pregnancy, second trimester: Secondary | ICD-10-CM

## 2022-01-05 LAB — POCT URINALYSIS DIPSTICK OB
Glucose, UA: NEGATIVE
POC,PROTEIN,UA: NEGATIVE

## 2022-01-05 NOTE — Progress Notes (Signed)
Routine Prenatal Care Visit  Subjective  Renee Lawrence is a 31 y.o. G1P0000 at [redacted]w[redacted]d being seen today for ongoing prenatal care.  She is currently monitored for the following issues for this low-risk pregnancy and has Increased risk of breast cancer; Family history of breast cancer; Epigastric pain; Supervision of other normal pregnancy, antepartum; Rubella non-immune status, antepartum; and Obesity affecting pregnancy on their problem list.  ----------------------------------------------------------------------------------- Patient reports nausea improving- good and bad days. Reviewed comfort measures.   Contractions: Not present. Vag. Bleeding: None.  Movement: Absent. Leaking Fluid denies.  ----------------------------------------------------------------------------------- The following portions of the patient's history were reviewed and updated as appropriate: allergies, current medications, past family history, past medical history, past social history, past surgical history and problem list. Problem list updated.  Objective  Blood pressure 122/80, weight 260 lb (117.9 kg), last menstrual period 09/08/2021. Pregravid weight 266 lb (120.7 kg) Total Weight Gain -6 lb (-2.722 kg) Urinalysis: Urine Protein Negative  Urine Glucose Negative  Fetal Status: Fetal Heart Rate (bpm): 155   Movement: Absent     General:  Alert, oriented and cooperative. Patient is in no acute distress.  Skin: Skin is warm and dry. No rash noted.   Cardiovascular: Normal heart rate noted  Respiratory: Normal respiratory effort, no problems with respiration noted  Abdomen: Soft, gravid, appropriate for gestational age. Pain/Pressure: Absent     Pelvic:  Cervical exam deferred        Extremities: Normal range of motion.     Mental Status: Normal mood and affect. Normal behavior. Normal judgment and thought content.   Assessment   31 y.o. G1P0000 at [redacted]w[redacted]d by  07/07/2022, by Ultrasound presenting for routine  prenatal visit  Plan   FIRST Problems (from 10/25/21 to present)    Problem Noted Resolved   Obesity affecting pregnancy 01/05/2022 by Tresea Mall, CNM No       Preterm labor symptoms and general obstetric precautions including but not limited to vaginal bleeding, contractions, leaking of fluid and fetal movement were reviewed in detail with the patient. Please refer to After Visit Summary for other counseling recommendations.   Return in about 4 weeks (around 02/02/2022) for MFM anatomy scan in 4 weeks and rob after.  Tresea Mall, CNM 01/05/2022 2:32 PM

## 2022-01-05 NOTE — Patient Instructions (Signed)

## 2022-01-05 NOTE — Progress Notes (Signed)
ROB- no concerns 

## 2022-01-11 ENCOUNTER — Other Ambulatory Visit: Payer: Self-pay

## 2022-01-11 DIAGNOSIS — O99212 Obesity complicating pregnancy, second trimester: Secondary | ICD-10-CM

## 2022-01-11 DIAGNOSIS — Z369 Encounter for antenatal screening, unspecified: Secondary | ICD-10-CM

## 2022-02-05 ENCOUNTER — Encounter: Payer: BC Managed Care – PPO | Admitting: Obstetrics & Gynecology

## 2022-02-13 ENCOUNTER — Ambulatory Visit: Payer: BC Managed Care – PPO

## 2022-02-13 ENCOUNTER — Other Ambulatory Visit: Payer: Self-pay

## 2022-02-13 ENCOUNTER — Ambulatory Visit: Payer: BC Managed Care – PPO | Attending: Obstetrics and Gynecology

## 2022-02-13 DIAGNOSIS — E669 Obesity, unspecified: Secondary | ICD-10-CM | POA: Diagnosis not present

## 2022-02-13 DIAGNOSIS — Z369 Encounter for antenatal screening, unspecified: Secondary | ICD-10-CM

## 2022-02-13 DIAGNOSIS — Z3A19 19 weeks gestation of pregnancy: Secondary | ICD-10-CM | POA: Insufficient documentation

## 2022-02-13 DIAGNOSIS — O99212 Obesity complicating pregnancy, second trimester: Secondary | ICD-10-CM | POA: Diagnosis present

## 2022-02-13 DIAGNOSIS — Z363 Encounter for antenatal screening for malformations: Secondary | ICD-10-CM | POA: Diagnosis not present

## 2022-02-15 ENCOUNTER — Ambulatory Visit (INDEPENDENT_AMBULATORY_CARE_PROVIDER_SITE_OTHER): Payer: BC Managed Care – PPO | Admitting: Obstetrics

## 2022-02-15 VITALS — BP 128/84 | Wt 258.0 lb

## 2022-02-15 DIAGNOSIS — Z3482 Encounter for supervision of other normal pregnancy, second trimester: Secondary | ICD-10-CM

## 2022-02-15 DIAGNOSIS — Z3480 Encounter for supervision of other normal pregnancy, unspecified trimester: Secondary | ICD-10-CM

## 2022-02-15 DIAGNOSIS — Z3A19 19 weeks gestation of pregnancy: Secondary | ICD-10-CM

## 2022-02-15 NOTE — Progress Notes (Signed)
No vb. No lof. Anatomy u/s on Tuesday.

## 2022-02-15 NOTE — Progress Notes (Signed)
Routine Prenatal Care Visit  Subjective  Renee Lawrence is a 31 y.o. G1P0000 at [redacted]w[redacted]d being seen today for ongoing prenatal care.  She is currently monitored for the following issues for this low-risk pregnancy and has Increased risk of breast cancer; Family history of breast cancer; Epigastric pain; Supervision of other normal pregnancy, antepartum; Rubella non-immune status, antepartum; and Obesity affecting pregnancy on their problem list.  ----------------------------------------------------------------------------------- Patient reports no complaints.  She is aware of fetal movement. Her anatomy scan was incomplete so she  Is having another scan in about 4 weeks. Contractions: Not present. Vag. Bleeding: None.  Movement: Present. Leaking Fluid denies.  ----------------------------------------------------------------------------------- The following portions of the patient's history were reviewed and updated as appropriate: allergies, current medications, past family history, past medical history, past social history, past surgical history and problem list. Problem list updated.  Objective  Blood pressure 128/84, weight 258 lb (117 kg), last menstrual period 09/08/2021. Pregravid weight 266 lb (120.7 kg) Total Weight Gain -8 lb (-3.629 kg) Urinalysis: Urine Protein    Urine Glucose    Fetal Status:     Movement: Present     General:  Alert, oriented and cooperative. Patient is in no acute distress.  Skin: Skin is warm and dry. No rash noted.   Cardiovascular: Normal heart rate noted  Respiratory: Normal respiratory effort, no problems with respiration noted  Abdomen: Soft, gravid, appropriate for gestational age. Pain/Pressure: Absent     Pelvic:  Cervical exam deferred        Extremities: Normal range of motion.     Mental Status: Normal mood and affect. Normal behavior. Normal judgment and thought content.   Assessment   31 y.o. G1P0000 at [redacted]w[redacted]d by  07/07/2022, by Ultrasound  presenting for routine prenatal visit  Plan   FIRST Problems (from 10/25/21 to present)    Problem Noted Resolved   Obesity affecting pregnancy 01/05/2022 by Tresea Mall, CNM No       Preterm labor symptoms and general obstetric precautions including but not limited to vaginal bleeding, contractions, leaking of fluid and fetal movement were reviewed in detail with the patient. Please refer to After Visit Summary for other counseling recommendations.   Return in about 4 weeks (around 03/15/2022) for return OB.  Mirna Mires, CNM  02/15/2022 9:58 AM

## 2022-03-08 ENCOUNTER — Other Ambulatory Visit: Payer: Self-pay

## 2022-03-08 DIAGNOSIS — O99212 Obesity complicating pregnancy, second trimester: Secondary | ICD-10-CM

## 2022-03-13 ENCOUNTER — Ambulatory Visit: Payer: BC Managed Care – PPO | Attending: Obstetrics and Gynecology

## 2022-03-13 ENCOUNTER — Other Ambulatory Visit: Payer: Self-pay

## 2022-03-13 DIAGNOSIS — O359XX0 Maternal care for (suspected) fetal abnormality and damage, unspecified, not applicable or unspecified: Secondary | ICD-10-CM

## 2022-03-13 DIAGNOSIS — E669 Obesity, unspecified: Secondary | ICD-10-CM | POA: Diagnosis not present

## 2022-03-13 DIAGNOSIS — Z3A23 23 weeks gestation of pregnancy: Secondary | ICD-10-CM | POA: Insufficient documentation

## 2022-03-13 DIAGNOSIS — O99212 Obesity complicating pregnancy, second trimester: Secondary | ICD-10-CM | POA: Insufficient documentation

## 2022-03-15 ENCOUNTER — Ambulatory Visit (INDEPENDENT_AMBULATORY_CARE_PROVIDER_SITE_OTHER): Payer: BC Managed Care – PPO | Admitting: Obstetrics & Gynecology

## 2022-03-15 VITALS — BP 124/80 | Wt 257.0 lb

## 2022-03-15 DIAGNOSIS — Z3482 Encounter for supervision of other normal pregnancy, second trimester: Secondary | ICD-10-CM

## 2022-03-15 NOTE — Progress Notes (Unsigned)
No vb. No lof.  

## 2022-03-18 ENCOUNTER — Encounter: Payer: Self-pay | Admitting: Obstetrics & Gynecology

## 2022-03-18 NOTE — Progress Notes (Signed)
Subjective:    Renee Lawrence is a 31 y.o. female being seen today for her obstetrical visit. She is at [redacted]w[redacted]d gestation. Patient reports no bleeding, no contractions, no cramping, and no leaking. Fetal movement: normal.  Menstrual History: OB History     Gravida  1   Para  0   Term  0   Preterm  0   AB  0   Living  0      SAB  0   IAB  0   Ectopic  0   Multiple  0   Live Births               Patient's last menstrual period was 09/08/2021 (exact date).    The following portions of the patient's history were reviewed and updated as appropriate: allergies, current medications, past family history, past medical history, past social history, past surgical history, and problem list.  Review of Systems A comprehensive review of systems was negative.   Objective:    BP 124/80   Wt 257 lb (116.6 kg)   LMP 09/08/2021 (Exact Date)   BMI 36.88 kg/m  FHT: 148 BPM  Uterine Size: 25     Assessment:    Pregnancy 23 and 5/7 weeks   Plan:    Ptl precautions given Follow up in 3 weeks.

## 2022-04-04 ENCOUNTER — Encounter: Payer: Self-pay | Admitting: Obstetrics

## 2022-04-06 ENCOUNTER — Encounter: Payer: Self-pay | Admitting: Licensed Practical Nurse

## 2022-04-16 ENCOUNTER — Ambulatory Visit (INDEPENDENT_AMBULATORY_CARE_PROVIDER_SITE_OTHER): Payer: BC Managed Care – PPO | Admitting: Certified Nurse Midwife

## 2022-04-16 ENCOUNTER — Encounter: Payer: Self-pay | Admitting: Certified Nurse Midwife

## 2022-04-16 VITALS — BP 117/77 | HR 101 | Wt 255.8 lb

## 2022-04-16 DIAGNOSIS — Z23 Encounter for immunization: Secondary | ICD-10-CM | POA: Diagnosis not present

## 2022-04-16 DIAGNOSIS — Z3A28 28 weeks gestation of pregnancy: Secondary | ICD-10-CM

## 2022-04-16 DIAGNOSIS — Z3483 Encounter for supervision of other normal pregnancy, third trimester: Secondary | ICD-10-CM

## 2022-04-16 DIAGNOSIS — Z113 Encounter for screening for infections with a predominantly sexual mode of transmission: Secondary | ICD-10-CM

## 2022-04-16 DIAGNOSIS — Z131 Encounter for screening for diabetes mellitus: Secondary | ICD-10-CM

## 2022-04-16 LAB — POCT URINALYSIS DIPSTICK OB
Bilirubin, UA: NEGATIVE
Blood, UA: NEGATIVE
Glucose, UA: NEGATIVE
Ketones, UA: NEGATIVE
Leukocytes, UA: NEGATIVE
Nitrite, UA: NEGATIVE
POC,PROTEIN,UA: NEGATIVE
Spec Grav, UA: 1.01 (ref 1.010–1.025)
Urobilinogen, UA: 0.2 E.U./dL
pH, UA: 5 (ref 5.0–8.0)

## 2022-04-16 NOTE — Patient Instructions (Signed)
Oral Glucose Tolerance Test During Pregnancy Why am I having this test? The oral glucose tolerance test (OGTT) is done to check how your body processes blood sugar (glucose). This is one of several tests used to diagnose diabetes that develops during pregnancy (gestational diabetes mellitus). Gestational diabetes is a short-term form of diabetes that some women develop while they are pregnant. It usually occurs during the second trimester of pregnancy and goes away after delivery. Testing, or screening, for gestational diabetes usually occurs at weeks 24-28 of pregnancy. You may have the OGTT test after having a 1-hour glucose screening test if the results from that test indicate that you may have gestational diabetes. This test may also be needed if: You have a history of gestational diabetes. There is a history of giving birth to very large babies or of losing pregnancies (having stillbirths). You have signs and symptoms of diabetes, such as: Changes in your eyesight. Tingling or numbness in your hands or feet. Changes in hunger, thirst, and urination, and these are not explained by your pregnancy. What is being tested? This test measures the amount of glucose in your blood at different times during a period of 3 hours. This shows how well your body can process glucose. What kind of sample is taken?  Blood samples are required for this test. They are usually collected by inserting a needle into a blood vessel. How do I prepare for this test? For 3 days before your test, eat normally. Have plenty of carbohydrate-rich foods. Follow instructions from your health care provider about: Eating or drinking restrictions on the day of the test. You may be asked not to eat or drink anything other than water (to fast) starting 8-10 hours before the test. Changing or stopping your regular medicines. Some medicines may interfere with this test. Tell a health care provider about: All medicines you are  taking, including vitamins, herbs, eye drops, creams, and over-the-counter medicines. Any blood disorders you have. Any surgeries you have had. Any medical conditions you have. What happens during the test? First, your blood glucose will be measured. This is referred to as your fasting blood glucose because you fasted before the test. Then, you will drink a glucose solution that contains a certain amount of glucose. Your blood glucose will be measured again 1, 2, and 3 hours after you drink the solution. This test takes about 3 hours to complete. You will need to stay at the testing location during this time. During the testing period: Do not eat or drink anything other than the glucose solution. Do not exercise. Do not use any products that contain nicotine or tobacco, such as cigarettes, e-cigarettes, and chewing tobacco. These can affect your test results. If you need help quitting, ask your health care provider. The testing procedure may vary among health care providers and hospitals. How are the results reported? Your results will be reported as milligrams of glucose per deciliter of blood (mg/dL) or millimoles per liter (mmol/L). There is more than one source for screening and diagnosis reference values used to diagnose gestational diabetes. Your health care provider will compare your results to normal values that were established after testing a large group of people (reference values). Reference values may vary among labs and hospitals. For this test ([facility]), reference values are: Fasting: 95 mg/dL (5.3 mmol/L). 1 hour: 180 mg/dL (10.0 mmol/L). 2 hour: 155 mg/dL (8.6 mmol/L). 3 hour: 140 mg/dL (7.8 mmol/L). What do the results mean? Results below the reference values are   considered normal. If two or more of your blood glucose levels are at or above the reference values, you may be diagnosed with gestational diabetes. If only one level is high, your health care provider may  suggest repeat testing or other tests to confirm a diagnosis. Talk with your health care provider about what your results mean. Questions to ask your health care provider Ask your health care provider, or the department that is doing the test: When will my results be ready? How will I get my results? What are my treatment options? What other tests do I need? What are my next steps? Summary The oral glucose tolerance test (OGTT) is one of several tests used to diagnose diabetes that develops during pregnancy (gestational diabetes mellitus). Gestational diabetes is a short-term form of diabetes that some women develop while they are pregnant. You may have the OGTT test after having a 1-hour glucose screening test if the results from that test show that you may have gestational diabetes. You may also have this test if you have any symptoms or risk factors for this type of diabetes. Talk with your health care provider about what your results mean. This information is not intended to replace advice given to you by your health care provider. Make sure you discuss any questions you have with your health care provider. Document Revised: 11/26/2019 Document Reviewed: 11/26/2019 Elsevier Patient Education  2023 [employer] Inc[employer]  

## 2022-04-16 NOTE — Progress Notes (Signed)
Body mass index is 36.7 kg/m. ROB doing well, feeling good movement. Did not have labs today. Will come back as soon as possible for glucose screen and CBC. BTC. Sample birth plan given, information on birth control given as well. Discussed that BMI less than 40 , u/s for growth not indicated. Recommended for BMI greater than 40. She verbalizes understanding. ROB in 2 wks.   Philip Aspen, CNM

## 2022-04-17 ENCOUNTER — Encounter: Payer: Self-pay | Admitting: Certified Nurse Midwife

## 2022-04-19 ENCOUNTER — Other Ambulatory Visit: Payer: BC Managed Care – PPO

## 2022-04-19 ENCOUNTER — Other Ambulatory Visit: Payer: Self-pay

## 2022-04-19 DIAGNOSIS — Z3A28 28 weeks gestation of pregnancy: Secondary | ICD-10-CM

## 2022-04-20 LAB — CBC
Hematocrit: 36.9 % (ref 34.0–46.6)
Hemoglobin: 12.6 g/dL (ref 11.1–15.9)
MCH: 29.6 pg (ref 26.6–33.0)
MCHC: 34.1 g/dL (ref 31.5–35.7)
MCV: 87 fL (ref 79–97)
Platelets: 310 10*3/uL (ref 150–450)
RBC: 4.26 x10E6/uL (ref 3.77–5.28)
RDW: 13 % (ref 11.7–15.4)
WBC: 12.2 10*3/uL — ABNORMAL HIGH (ref 3.4–10.8)

## 2022-04-20 LAB — RPR: RPR Ser Ql: NONREACTIVE

## 2022-04-20 LAB — GLUCOSE, 1 HOUR GESTATIONAL: Gestational Diabetes Screen: 101 mg/dL (ref 70–139)

## 2022-04-30 ENCOUNTER — Encounter: Payer: Self-pay | Admitting: Advanced Practice Midwife

## 2022-04-30 ENCOUNTER — Ambulatory Visit (INDEPENDENT_AMBULATORY_CARE_PROVIDER_SITE_OTHER): Payer: BC Managed Care – PPO | Admitting: Advanced Practice Midwife

## 2022-04-30 VITALS — BP 123/83 | HR 107 | Wt 256.0 lb

## 2022-04-30 DIAGNOSIS — Z3403 Encounter for supervision of normal first pregnancy, third trimester: Secondary | ICD-10-CM

## 2022-04-30 DIAGNOSIS — Z3A30 30 weeks gestation of pregnancy: Secondary | ICD-10-CM

## 2022-04-30 DIAGNOSIS — E669 Obesity, unspecified: Secondary | ICD-10-CM

## 2022-04-30 DIAGNOSIS — O99213 Obesity complicating pregnancy, third trimester: Secondary | ICD-10-CM

## 2022-04-30 NOTE — Progress Notes (Signed)
Routine Prenatal Care Visit  Subjective  Renee Lawrence is a 31 y.o. G1P0000 at [redacted]w[redacted]d being seen today for ongoing prenatal care.  She is currently monitored for the following issues for this low-risk pregnancy and has Increased risk of breast cancer; Family history of breast cancer; Epigastric pain; Supervision of normal pregnancy; Rubella non-immune status, antepartum; and Obesity affecting pregnancy on their problem list.  ----------------------------------------------------------------------------------- Patient reports muscle pain in her thighs. Comfort measures reviewed.   Contractions: Not present. Vag. Bleeding: None.  Movement: Present. Leaking Fluid denies.  ----------------------------------------------------------------------------------- The following portions of the patient's history were reviewed and updated as appropriate: allergies, current medications, past family history, past medical history, past social history, past surgical history and problem list. Problem list updated.  Objective  Blood pressure 123/83, pulse (!) 107, weight 256 lb (116.1 kg), last menstrual period 09/08/2021. Pregravid weight 266 lb (120.7 kg) Total Weight Gain -10 lb (-4.536 kg) Urinalysis: Urine Protein    Urine Glucose    Fetal Status: Fetal Heart Rate (bpm): 138   Movement: Present     General:  Alert, oriented and cooperative. Patient is in no acute distress.  Skin: Skin is warm and dry. No rash noted.   Cardiovascular: Normal heart rate noted  Respiratory: Normal respiratory effort, no problems with respiration noted  Abdomen: Soft, gravid, appropriate for gestational age. Pain/Pressure: Absent     Pelvic:  Cervical exam deferred        Extremities: Normal range of motion.  Edema: None  Mental Status: Normal mood and affect. Normal behavior. Normal judgment and thought content.   Assessment   31 y.o. G1P0000 at [redacted]w[redacted]d by  07/07/2022, by Ultrasound presenting for routine prenatal  visit  Plan   FIRST Problems (from 10/25/21 to present)    Problem Noted Resolved   Obesity affecting pregnancy 01/05/2022 by Rod Can, CNM No   Supervision of normal pregnancy 10/25/2021 by Cleophas Dunker, [place] No   Overview Addendum 02/15/2022  9:42 AM by Imagene Riches, CNM     Nursing Staff Provider  Office Location  [facility]    Language  English Anatomy US  Normal female  Flu Vaccine   Genetic Screen  NIPS:   TDaP vaccine    Hgb A1C or  GTT Early : Third trimester :   Covid    LAB RESULTS   Rhogam   Blood Type     Feeding Plan  Antibody    Contraception  Rubella    Circumcision  RPR     Pediatrician   HBsAg     Support Person  HIV    Prenatal Classes  Varicella     GBS  (For PCN allergy, check sensitivities)   BTL Consent     VBAC Consent  Pap  NILM    Hgb Electro      CF      SMA                   Preterm labor symptoms and general obstetric precautions including but not limited to vaginal bleeding, contractions, leaking of fluid and fetal movement were reviewed in detail with the patient. Please refer to After Visit Summary for other counseling recommendations.   Return in about 2 weeks (around 05/14/2022) for rob.  Rod Can, CNM 04/30/2022 4:50 PM

## 2022-04-30 NOTE — Progress Notes (Signed)
ROB- no concerns, declines flu shot today

## 2022-05-10 ENCOUNTER — Other Ambulatory Visit: Payer: Self-pay

## 2022-05-10 DIAGNOSIS — O99212 Obesity complicating pregnancy, second trimester: Secondary | ICD-10-CM

## 2022-05-15 ENCOUNTER — Ambulatory Visit: Payer: BC Managed Care – PPO | Attending: Obstetrics

## 2022-05-15 ENCOUNTER — Other Ambulatory Visit: Payer: Self-pay

## 2022-05-15 VITALS — BP 137/107 | HR 101 | Ht 70.0 in | Wt 255.5 lb

## 2022-05-15 DIAGNOSIS — Z3A32 32 weeks gestation of pregnancy: Secondary | ICD-10-CM | POA: Diagnosis not present

## 2022-05-15 DIAGNOSIS — O99213 Obesity complicating pregnancy, third trimester: Secondary | ICD-10-CM | POA: Diagnosis present

## 2022-05-15 DIAGNOSIS — E669 Obesity, unspecified: Secondary | ICD-10-CM | POA: Diagnosis not present

## 2022-05-15 DIAGNOSIS — Z3403 Encounter for supervision of normal first pregnancy, third trimester: Secondary | ICD-10-CM

## 2022-05-15 DIAGNOSIS — O99212 Obesity complicating pregnancy, second trimester: Secondary | ICD-10-CM

## 2022-05-16 ENCOUNTER — Ambulatory Visit (INDEPENDENT_AMBULATORY_CARE_PROVIDER_SITE_OTHER): Payer: BC Managed Care – PPO | Admitting: Licensed Practical Nurse

## 2022-05-16 ENCOUNTER — Encounter: Payer: Self-pay | Admitting: Licensed Practical Nurse

## 2022-05-16 VITALS — BP 126/90 | HR 90 | Wt 253.0 lb

## 2022-05-16 DIAGNOSIS — O99213 Obesity complicating pregnancy, third trimester: Secondary | ICD-10-CM

## 2022-05-16 DIAGNOSIS — E669 Obesity, unspecified: Secondary | ICD-10-CM

## 2022-05-16 DIAGNOSIS — Z3403 Encounter for supervision of normal first pregnancy, third trimester: Secondary | ICD-10-CM

## 2022-05-16 DIAGNOSIS — Z3A32 32 weeks gestation of pregnancy: Secondary | ICD-10-CM

## 2022-05-16 LAB — POCT URINALYSIS DIPSTICK
Bilirubin, UA: NEGATIVE
Blood, UA: NEGATIVE
Glucose, UA: NEGATIVE
Ketones, UA: NEGATIVE
Leukocytes, UA: NEGATIVE
Nitrite, UA: NEGATIVE
Protein, UA: NEGATIVE
Spec Grav, UA: 1.01 (ref 1.010–1.025)
Urobilinogen, UA: 1 E.U./dL
pH, UA: 6 (ref 5.0–8.0)

## 2022-05-16 NOTE — Progress Notes (Signed)
Routine Prenatal Care Visit  Subjective  Renee Lawrence is a 31 y.o. G1P0000 at [redacted]w[redacted]d being seen today for ongoing prenatal care.  She is currently monitored for the following issues for this high-risk pregnancy and has Increased risk of breast cancer; Family history of breast cancer; Epigastric pain; Supervision of normal pregnancy; Rubella non-immune status, antepartum; and Obesity affecting pregnancy on their problem list.  ----------------------------------------------------------------------------------- Patient reports no complaints.  Doing well.  Denies HA, visual disturbances or RUQ pain. Today's BP elevated, reports she had high BP early in pregnancy but has never been told that she has CHTN. Lab tech not available for labs, discussed going to [facility] for work up, pt prefers to return on Friday for BP check, aware she may need labs and consider medication.  -Doing lots of reading on labor/birth and breat feeding -reviewed wt gain, pt has good appetite-overall doing fine -had growth [place] yesterday EFW 83%, normal AFI will repeat in 4 weeks  Contractions: Not present. Vag. Bleeding: None.  Movement: Present. Leaking Fluid denies.  ----------------------------------------------------------------------------------- The following portions of the patient's history were reviewed and updated as appropriate: allergies, current medications, past family history, past medical history, past social history, past surgical history and problem list. Problem list updated.  Objective  Blood pressure (!) 126/90, pulse 90, weight 253 lb (114.8 kg), last menstrual period 09/08/2021. Pregravid weight 266 lb (120.7 kg) Total Weight Gain -13 lb (-5.897 kg) Urinalysis: Urine Protein    Urine Glucose    Fetal Status: Fetal Heart Rate (bpm): 150   Movement: Present     General:  Alert, oriented and cooperative. Patient is in no acute distress.  Skin: Skin is warm and dry. No rash noted.   Cardiovascular: Normal  heart rate noted  Respiratory: Normal respiratory effort, no problems with respiration noted  Abdomen: Soft, gravid, appropriate for gestational age. Pain/Pressure: Absent     Pelvic:  Cervical exam deferred        Extremities: Normal range of motion.  Edema: None  Mental Status: Normal mood and affect. Normal behavior. Normal judgment and thought content.   Assessment   31 y.o. G1P0000 at [redacted]w[redacted]d by  07/07/2022, by Ultrasound presenting for routine prenatal visit Elevated Blood Pressure   Plan   FIRST Problems (from 10/25/21 to present)     Problem Noted Resolved   Obesity affecting pregnancy 01/05/2022 by Tresea Mall, CNM No   Supervision of normal pregnancy 10/25/2021 by Loran Senters, CMA No   Overview Addendum 02/15/2022  9:42 AM by Mirna Mires, CNM     Nursing Staff Provider  Office Location  [facility] Dating    Language  English Anatomy US  Normal female  Flu Vaccine   Genetic Screen  NIPS:   TDaP vaccine    Hgb A1C or  GTT Early : Third trimester :   Covid    LAB RESULTS   Rhogam   Blood Type     Feeding Plan  Antibody    Contraception  Rubella    Circumcision  RPR     Pediatrician   HBsAg     Support Person  HIV    Prenatal Classes  Varicella     GBS  (For PCN allergy, check sensitivities)   BTL Consent     VBAC Consent  Pap  NILM    Hgb Electro      CF      SMA  Preterm labor symptoms and general obstetric precautions including but not limited to vaginal bleeding, contractions, leaking of fluid and fetal movement were reviewed in detail with the patient. Please refer to After Visit Summary for other counseling recommendations.   Return in about 2 days (around 05/18/2022) for BP check, labs.  Repeat growth [place] in 4 weeks ordered   Carie Caddy, [place]  [facility]  05/16/22  7:14 PM

## 2022-05-18 ENCOUNTER — Ambulatory Visit (INDEPENDENT_AMBULATORY_CARE_PROVIDER_SITE_OTHER): Payer: BC Managed Care – PPO

## 2022-05-18 VITALS — BP 118/80 | Ht 70.0 in | Wt 253.0 lb

## 2022-05-18 DIAGNOSIS — Z3403 Encounter for supervision of normal first pregnancy, third trimester: Secondary | ICD-10-CM

## 2022-05-18 DIAGNOSIS — Z0130 Encounter for examination of blood pressure without abnormal findings: Secondary | ICD-10-CM

## 2022-05-18 NOTE — Progress Notes (Signed)
Pt was here for a BP check. 118/80, pt reports she feels fine.

## 2022-05-30 ENCOUNTER — Ambulatory Visit (INDEPENDENT_AMBULATORY_CARE_PROVIDER_SITE_OTHER): Payer: BC Managed Care – PPO | Admitting: Advanced Practice Midwife

## 2022-05-30 ENCOUNTER — Encounter: Payer: Self-pay | Admitting: Advanced Practice Midwife

## 2022-05-30 VITALS — BP 120/80 | Wt 255.0 lb

## 2022-05-30 DIAGNOSIS — Z3403 Encounter for supervision of normal first pregnancy, third trimester: Secondary | ICD-10-CM

## 2022-05-30 DIAGNOSIS — Z3A34 34 weeks gestation of pregnancy: Secondary | ICD-10-CM

## 2022-05-30 DIAGNOSIS — O99213 Obesity complicating pregnancy, third trimester: Secondary | ICD-10-CM

## 2022-05-30 DIAGNOSIS — Z369 Encounter for antenatal screening, unspecified: Secondary | ICD-10-CM

## 2022-05-30 DIAGNOSIS — E669 Obesity, unspecified: Secondary | ICD-10-CM

## 2022-05-30 DIAGNOSIS — O26843 Uterine size-date discrepancy, third trimester: Secondary | ICD-10-CM

## 2022-05-30 NOTE — Progress Notes (Signed)
Routine Prenatal Care Visit  Subjective  Renee Lawrence is a 31 y.o. G1P0000 at [redacted]w[redacted]d being seen today for ongoing prenatal care.  She is currently monitored for the following issues for this low-risk pregnancy and has Increased risk of breast cancer; Family history of breast cancer; Epigastric pain; Supervision of normal pregnancy; Rubella non-immune status, antepartum; and Obesity affecting pregnancy on their problem list.  ----------------------------------------------------------------------------------- Patient reports no complaints.  Discussed f/u growth u/s in 2 weeks and GBS at next visit. Contractions: Not present. Vag. Bleeding: None.  Movement: Present. Leaking Fluid denies.  ----------------------------------------------------------------------------------- The following portions of the patient's history were reviewed and updated as appropriate: allergies, current medications, past family history, past medical history, past social history, past surgical history and problem list. Problem list updated.  Objective  Blood pressure 120/80, weight 255 lb (115.7 kg), last menstrual period 09/08/2021. Pregravid weight 266 lb (120.7 kg) Total Weight Gain -11 lb (-4.99 kg) Urinalysis: Urine Protein    Urine Glucose    Fetal Status: Fetal Heart Rate (bpm): 142 Fundal Height: 38 cm Movement: Present     General:  Alert, oriented and cooperative. Patient is in no acute distress.  Skin: Skin is warm and dry. No rash noted.   Cardiovascular: Normal heart rate noted  Respiratory: Normal respiratory effort, no problems with respiration noted  Abdomen: Soft, gravid, appropriate for gestational age. Pain/Pressure: Absent     Pelvic:  Cervical exam deferred        Extremities: Normal range of motion.  Edema: None  Mental Status: Normal mood and affect. Normal behavior. Normal judgment and thought content.   Assessment   31 y.o. G1P0000 at [redacted]w[redacted]d by  07/07/2022, by Ultrasound presenting for  routine prenatal visit  Plan   FIRST Problems (from 10/25/21 to present)     Problem Noted Resolved   Obesity affecting pregnancy 01/05/2022 by Tresea Mall, CNM No   Supervision of normal pregnancy 10/25/2021 by Loran Senters, CMA No   Overview Addendum 05/30/2022  4:11 PM by Cornelius Moras, CMA     Nursing Staff Provider  Office Location  [facility] Dating    Language  English Anatomy US  Normal female  Flu Vaccine  declined Genetic Screen  NIPS:   TDaP vaccine   04/16/22 Hgb A1C or  GTT Early : Third trimester :   Covid    LAB RESULTS   Rhogam   Blood Type   O Positive   Feeding Plan  Antibody    Contraception  Rubella    Circumcision  RPR     Non Reactive    Pediatrician   HBsAg     Support Person  HIV  Non Reactive   Prenatal Classes  Varicella     GBS  (For PCN allergy, check sensitivities)   BTL Consent     VBAC Consent  Pap  NILM    Hgb Electro      CF      SMA                   Preterm labor symptoms and general obstetric precautions including but not limited to vaginal bleeding, contractions, leaking of fluid and fetal movement were reviewed in detail with the patient. Please refer to After Visit Summary for other counseling recommendations.   Return in about 2 weeks (around 06/13/2022) for growth scan and rob after.  Tresea Mall, CNM 05/30/2022 4:31 PM

## 2022-05-30 NOTE — Patient Instructions (Signed)

## 2022-06-13 ENCOUNTER — Ambulatory Visit (INDEPENDENT_AMBULATORY_CARE_PROVIDER_SITE_OTHER): Payer: BC Managed Care – PPO

## 2022-06-13 DIAGNOSIS — Z3A37 37 weeks gestation of pregnancy: Secondary | ICD-10-CM | POA: Diagnosis not present

## 2022-06-13 DIAGNOSIS — O26843 Uterine size-date discrepancy, third trimester: Secondary | ICD-10-CM

## 2022-06-13 DIAGNOSIS — Z3403 Encounter for supervision of normal first pregnancy, third trimester: Secondary | ICD-10-CM

## 2022-06-13 DIAGNOSIS — Z369 Encounter for antenatal screening, unspecified: Secondary | ICD-10-CM | POA: Diagnosis not present

## 2022-06-14 ENCOUNTER — Other Ambulatory Visit: Payer: Self-pay | Admitting: Licensed Practical Nurse

## 2022-06-14 ENCOUNTER — Ambulatory Visit (INDEPENDENT_AMBULATORY_CARE_PROVIDER_SITE_OTHER): Payer: BC Managed Care – PPO | Admitting: Licensed Practical Nurse

## 2022-06-14 ENCOUNTER — Encounter: Payer: Self-pay | Admitting: Licensed Practical Nurse

## 2022-06-14 VITALS — BP 128/96 | HR 90 | Wt 254.1 lb

## 2022-06-14 DIAGNOSIS — Z3403 Encounter for supervision of normal first pregnancy, third trimester: Secondary | ICD-10-CM

## 2022-06-14 DIAGNOSIS — Z3A36 36 weeks gestation of pregnancy: Secondary | ICD-10-CM

## 2022-06-14 DIAGNOSIS — O133 Gestational [pregnancy-induced] hypertension without significant proteinuria, third trimester: Secondary | ICD-10-CM

## 2022-06-14 DIAGNOSIS — Z3685 Encounter for antenatal screening for Streptococcus B: Secondary | ICD-10-CM

## 2022-06-14 LAB — POCT URINALYSIS DIPSTICK
Bilirubin, UA: NEGATIVE
Blood, UA: NEGATIVE
Glucose, UA: NEGATIVE
Ketones, UA: NEGATIVE
Leukocytes, UA: NEGATIVE
Nitrite, UA: NEGATIVE
Protein, UA: NEGATIVE
Spec Grav, UA: 1.015 (ref 1.010–1.025)
Urobilinogen, UA: 1 E.U./dL
pH, UA: 6.5 (ref 5.0–8.0)

## 2022-06-14 NOTE — Progress Notes (Signed)
Routine Prenatal Care Visit  Subjective  Renee Lawrence is a 31 y.o. G1P0000 at [redacted]w[redacted]d being seen today for ongoing prenatal care.  She is currently monitored for the following issues for this low-risk pregnancy and has Increased risk of breast cancer; Family history of breast cancer; Epigastric pain; Supervision of normal pregnancy; Rubella non-immune status, antepartum; and Obesity affecting pregnancy on their problem list.  ----------------------------------------------------------------------------------- Patient reports no complaints.  Doing well.  Admits to being stressed today-work was a little chaotic. Holiday break starts next week then she will start leave January 3.   -BP slightly elevated today, has had elevated BP at previous apts. Denies HA, visual disturbances or RUQ pain. Reviewed BP readings with Dr Logan Bores, at this time there is no reason to be concerned.   Contractions: Not present. Vag. Bleeding: None.  Movement: Present. Leaking Fluid denies.  ----------------------------------------------------------------------------------- The following portions of the patient's history were reviewed and updated as appropriate: allergies, current medications, past family history, past medical history, past social history, past surgical history and problem list. Problem list updated.  Objective  Blood pressure (!) 128/96, pulse 90, weight 254 lb 1.6 oz (115.3 kg), last menstrual period 09/08/2021. Pregravid weight 266 lb (120.7 kg) Total Weight Gain -11 lb 14.4 oz (-5.398 kg) Urinalysis: Urine Protein    Urine Glucose    Fetal Status: Fetal Heart Rate (bpm): 140 Fundal Height: 39 cm Movement: Present     General:  Alert, oriented and cooperative. Patient is in no acute distress.  Skin: Skin is warm and dry. No rash noted.   Cardiovascular: Normal heart rate noted  Respiratory: Normal respiratory effort, no problems with respiration noted  Abdomen: Soft, gravid, appropriate for  gestational age. Pain/Pressure: Absent     Pelvic:  Cervical exam deferred        Extremities: Normal range of motion.  Edema: Trace  Mental Status: Normal mood and affect. Normal behavior. Normal judgment and thought content.   Assessment   31 y.o. G1P0000 at [redacted]w[redacted]d by  07/07/2022, by Ultrasound presenting for routine prenatal visit  Plan   FIRST Problems (from 10/25/21 to present)     Problem Noted Resolved   Obesity affecting pregnancy 01/05/2022 by Tresea Mall, CNM No   Supervision of normal pregnancy 10/25/2021 by Loran Senters, CMA No   Overview Addendum 05/30/2022  4:11 PM by Cornelius Moras, CMA     Nursing Staff Provider  Office Location  [facility] Dating    Language  English Anatomy US  Normal female  Flu Vaccine  declined Genetic Screen  NIPS:   TDaP vaccine   04/16/22 Hgb A1C or  GTT Early : Third trimester :   Covid    LAB RESULTS   Rhogam   Blood Type   O Positive   Feeding Plan  Antibody    Contraception  Rubella    Circumcision  RPR     Non Reactive    Pediatrician   HBsAg     Support Person  HIV  Non Reactive   Prenatal Classes  Varicella     GBS  (For PCN allergy, check sensitivities)   BTL Consent     VBAC Consent  Pap  NILM    Hgb Electro      CF      SMA                   Preterm labor symptoms and general obstetric precautions including but not limited to vaginal bleeding, contractions, leaking  of fluid and fetal movement were reviewed in detail with the patient. Please refer to After Visit Summary for other counseling recommendations.   Return in about 1 week (around 06/21/2022) for ROB, NST .  GBS collected  Carie Caddy, CNM  [facility]  06/14/22  4:59 PM

## 2022-06-15 LAB — CBC WITH DIFFERENTIAL/PLATELET
Basophils Absolute: 0 10*3/uL (ref 0.0–0.2)
Basos: 0 %
EOS (ABSOLUTE): 0.1 10*3/uL (ref 0.0–0.4)
Eos: 1 %
Hematocrit: 39.5 % (ref 34.0–46.6)
Hemoglobin: 13.1 g/dL (ref 11.1–15.9)
Immature Grans (Abs): 0.1 10*3/uL (ref 0.0–0.1)
Immature Granulocytes: 1 %
Lymphocytes Absolute: 3.4 10*3/uL — ABNORMAL HIGH (ref 0.7–3.1)
Lymphs: 25 %
MCH: 28.4 pg (ref 26.6–33.0)
MCHC: 33.2 g/dL (ref 31.5–35.7)
MCV: 86 fL (ref 79–97)
Monocytes Absolute: 0.8 10*3/uL (ref 0.1–0.9)
Monocytes: 6 %
Neutrophils Absolute: 9.4 10*3/uL — ABNORMAL HIGH (ref 1.4–7.0)
Neutrophils: 67 %
Platelets: 298 10*3/uL (ref 150–450)
RBC: 4.62 x10E6/uL (ref 3.77–5.28)
RDW: 13.6 % (ref 11.7–15.4)
WBC: 13.9 10*3/uL — ABNORMAL HIGH (ref 3.4–10.8)

## 2022-06-15 LAB — COMPREHENSIVE METABOLIC PANEL
ALT: 13 IU/L (ref 0–32)
AST: 14 IU/L (ref 0–40)
Albumin/Globulin Ratio: 1.6 (ref 1.2–2.2)
Albumin: 3.8 g/dL — ABNORMAL LOW (ref 3.9–4.9)
Alkaline Phosphatase: 129 IU/L — ABNORMAL HIGH (ref 44–121)
BUN/Creatinine Ratio: 9 (ref 9–23)
BUN: 5 mg/dL — ABNORMAL LOW (ref 6–20)
Bilirubin Total: 0.3 mg/dL (ref 0.0–1.2)
CO2: 20 mmol/L (ref 20–29)
Calcium: 9.4 mg/dL (ref 8.7–10.2)
Chloride: 101 mmol/L (ref 96–106)
Creatinine, Ser: 0.56 mg/dL — ABNORMAL LOW (ref 0.57–1.00)
Globulin, Total: 2.4 g/dL (ref 1.5–4.5)
Glucose: 66 mg/dL — ABNORMAL LOW (ref 70–99)
Potassium: 4.1 mmol/L (ref 3.5–5.2)
Sodium: 136 mmol/L (ref 134–144)
Total Protein: 6.2 g/dL (ref 6.0–8.5)
eGFR: 124 mL/min/{1.73_m2} (ref >59)

## 2022-06-15 LAB — PROTEIN / CREATININE RATIO, URINE
Creatinine, Urine: 52.8 mg/dL
Protein, Ur: 9.2 mg/dL
Protein/Creat Ratio: 174 mg/g creat (ref 0–200)

## 2022-06-18 LAB — STREP GP B CULTURE+RFLX: Strep Gp B Culture+Rflx: NEGATIVE

## 2022-06-20 DIAGNOSIS — O133 Gestational [pregnancy-induced] hypertension without significant proteinuria, third trimester: Secondary | ICD-10-CM | POA: Insufficient documentation

## 2022-06-20 DIAGNOSIS — Z3A37 37 weeks gestation of pregnancy: Secondary | ICD-10-CM | POA: Insufficient documentation

## 2022-06-20 NOTE — Progress Notes (Signed)
    NURSE VISIT NOTE  Subjective:    Patient ID: Renee Lawrence, female    DOB: 1991-02-25, 31 y.o.   MRN: [id number]  HPI  Patient is a 31 y.o. G74P0000 female who presents for fetal monitoring per order from Carie Caddy, [place].   Objective:    BP 116/76   Pulse 91   Ht 5\' 10"  (1.778 m)   Wt 252 lb 9.6 oz (114.6 kg)   LMP 09/08/2021 (Exact Date)   BMI 36.24 kg/m  Estimated Date of Delivery: 07/07/22  Assessment:   1. Encounter for supervision of normal first pregnancy in third trimester   2. Gestational hypertension, third trimester   3. [redacted] weeks gestation of pregnancy      Plan:   Results reviewed and discussed with patient by  09/05/22, MD.     Brennan Bailey, LPN

## 2022-06-20 NOTE — Patient Instructions (Signed)

## 2022-06-21 ENCOUNTER — Ambulatory Visit (INDEPENDENT_AMBULATORY_CARE_PROVIDER_SITE_OTHER): Payer: BC Managed Care – PPO

## 2022-06-21 ENCOUNTER — Encounter: Payer: Self-pay | Admitting: Obstetrics and Gynecology

## 2022-06-21 ENCOUNTER — Ambulatory Visit (INDEPENDENT_AMBULATORY_CARE_PROVIDER_SITE_OTHER): Payer: BC Managed Care – PPO | Admitting: Obstetrics and Gynecology

## 2022-06-21 VITALS — BP 116/76 | HR 91 | Ht 70.0 in | Wt 252.6 lb

## 2022-06-21 VITALS — BP 116/76 | HR 91 | Wt 252.0 lb

## 2022-06-21 DIAGNOSIS — Z3403 Encounter for supervision of normal first pregnancy, third trimester: Secondary | ICD-10-CM

## 2022-06-21 DIAGNOSIS — Z3A37 37 weeks gestation of pregnancy: Secondary | ICD-10-CM

## 2022-06-21 DIAGNOSIS — O133 Gestational [pregnancy-induced] hypertension without significant proteinuria, third trimester: Secondary | ICD-10-CM

## 2022-06-21 NOTE — Progress Notes (Signed)
ROB: Doing well.  No complaints.  Reports daily fetal movement.  NST reactive today.  Negative CST as well but patient not feeling contractions at all.  No evidence of true gestational hypertension.

## 2022-06-21 NOTE — Progress Notes (Signed)
ROB. Patient states no issues since the last time she was here. Had NST today. Feeling fetal movement, has had some contractions. Patient states no questions or concerns at this time.

## 2022-06-27 ENCOUNTER — Ambulatory Visit (INDEPENDENT_AMBULATORY_CARE_PROVIDER_SITE_OTHER): Payer: BC Managed Care – PPO | Admitting: Obstetrics and Gynecology

## 2022-06-27 ENCOUNTER — Ambulatory Visit: Payer: BC Managed Care – PPO

## 2022-06-27 ENCOUNTER — Encounter: Payer: Self-pay | Admitting: Obstetrics and Gynecology

## 2022-06-27 VITALS — BP 126/85 | HR 104 | Wt 254.0 lb

## 2022-06-27 DIAGNOSIS — O133 Gestational [pregnancy-induced] hypertension without significant proteinuria, third trimester: Secondary | ICD-10-CM | POA: Diagnosis not present

## 2022-06-27 DIAGNOSIS — Z3A38 38 weeks gestation of pregnancy: Secondary | ICD-10-CM

## 2022-06-27 DIAGNOSIS — Z3403 Encounter for supervision of normal first pregnancy, third trimester: Secondary | ICD-10-CM

## 2022-06-27 DIAGNOSIS — O99213 Obesity complicating pregnancy, third trimester: Secondary | ICD-10-CM

## 2022-06-27 DIAGNOSIS — O169 Unspecified maternal hypertension, unspecified trimester: Secondary | ICD-10-CM

## 2022-06-27 LAB — POCT URINALYSIS DIPSTICK OB
Bilirubin, UA: NEGATIVE
Blood, UA: NEGATIVE
Glucose, UA: NEGATIVE
Ketones, UA: NEGATIVE
Leukocytes, UA: NEGATIVE
Nitrite, UA: NEGATIVE
POC,PROTEIN,UA: NEGATIVE
Spec Grav, UA: 1.01 (ref 1.010–1.025)
Urobilinogen, UA: 0.2 E.U./dL
pH, UA: 6 (ref 5.0–8.0)

## 2022-06-27 NOTE — Progress Notes (Signed)
    NURSE VISIT NOTE  Subjective:    Patient ID: Joanette Gula, female    DOB: 05-16-1991, 31 y.o.   MRN: [id number]  HPI  Patient is a 31 y.o. G57P0000 female who presents for fetal monitoring per order from Brennan Bailey, MD.   Objective:    BP 126/85   Pulse (!) 104   Ht 5\' 10"  (1.778 m)   Wt 254 lb (115.2 kg)   LMP 09/08/2021 (Exact Date)   BMI 36.45 kg/m  Estimated Date of Delivery: 07/07/22  Assessment:   No diagnosis found.   Plan:   Results reviewed and discussed with patient by  09/05/22, MD.     Hildred Laser, LPN

## 2022-06-27 NOTE — Progress Notes (Signed)
ROB: Doing well, no issues.  Patient with questionable gHTN due to 2 borderline elevated BPs, however patient notes that she was stressed from her job during that time. Is a Runner, broadcasting/film/video, now out on break and feels that her stress levels are better. NST performed today was reviewed and was found to be reactive.  Continue recommended antenatal testing and prenatal care. Patient can continue with pregnancy, no true need for IOL at this time. Also with very unfavorable cervix. Can continue to monitor. RTC in 1 week.    NONSTRESS TEST INTERPRETATION  INDICATIONS:  GHTN? and Obesity  FHR baseline: 145 bpm Variability: moderate Accelerations: present Decelerations: Absent RESULTS:Reactive COMMENTS: Occasional uterine irritability   PLAN: 1. Continue fetal kick counts twice a day. 2. Continue antepartum testing as scheduled-weekly

## 2022-06-27 NOTE — Patient Instructions (Signed)

## 2022-06-27 NOTE — Progress Notes (Signed)
Renee Lawrence. Patient states she is having some braxton hicks but no other questions or concerns at this time.

## 2022-06-28 ENCOUNTER — Other Ambulatory Visit: Payer: BC Managed Care – PPO

## 2022-06-28 ENCOUNTER — Encounter: Payer: Self-pay | Admitting: Obstetrics and Gynecology

## 2022-06-29 DIAGNOSIS — Z3A39 39 weeks gestation of pregnancy: Secondary | ICD-10-CM | POA: Insufficient documentation

## 2022-06-29 DIAGNOSIS — O169 Unspecified maternal hypertension, unspecified trimester: Secondary | ICD-10-CM | POA: Insufficient documentation

## 2022-06-29 NOTE — Patient Instructions (Incomplete)

## 2022-06-29 NOTE — Progress Notes (Signed)
    NURSE VISIT NOTE  Subjective:    Patient ID: Renee Lawrence, female    DOB: April 26, 1991, 31 y.o.   MRN: [id number]  HPI  Patient is a 31 y.o. G43P0000 female who presents for fetal monitoring per order from Hildred Laser, MD.   Objective:    BP 111/79   Pulse (!) 106   Ht 5\' 10"  (1.778 m)   Wt 252 lb 8 oz (114.5 kg)   LMP 09/08/2021 (Exact Date)   BMI 36.23 kg/m  Estimated Date of Delivery: 07/07/22  Assessment:   1. Encounter for supervision of normal first pregnancy in third trimester   2. Obesity affecting pregnancy in third trimester, unspecified obesity type   3. Elevated blood pressure affecting pregnancy, antepartum   4. [redacted] weeks gestation of pregnancy      Plan:   Results reviewed and discussed with patient by  09/05/22, MD.     Hildred Laser, LPN

## 2022-07-02 NOTE — L&D Delivery Note (Signed)
         Delivery Note   Renee Lawrence is a 32 y.o. G1P1001 at [redacted]w[redacted]d Estimated Date of Delivery: 07/07/22  PRE-OPERATIVE DIAGNOSIS:  1) [redacted]w[redacted]d pregnancy. Gestational Hypertension  POST-OPERATIVE DIAGNOSIS:  1) [redacted]w[redacted]d pregnancy s/p Vaginal, Vacuum (Extractor)    Delivery Type: Vaginal, Vacuum (Extractor)    Delivery Anesthesia: Epidural   Labor Complications:  chorioamnionitis     ESTIMATED BLOOD LOSS: 330  ml    FINDINGS:   1) female infant, Apgar scores of   8 at 1 minute and 9   at 5 minutes and a birthweight of   8 lbs 7 ounces.    2) Nuchal cord: no   SPECIMENS:   PLACENTA:   Appearance: Intact , 3 vessel cord    Removal: Spontaneous      Disposition:  to pathology   DISPOSITION:  Infant to left in stable condition in the delivery room, with L&D personnel and mother,  NARRATIVE SUMMARY: Labor course:  Renee Lawrence is a G1P1001 at [redacted]w[redacted]d who presented for induction of labor.  She progressed well in labor with pitocin.  She received the appropriate epidural anesthesia and proceeded to complete dilation.  She evidenced adequate maternal expulsive effort during the second stage. After pushing for 2.5 hrs she requesting assistance due to exhaustion and pain. Dr. Marcelline Mates call to bedside for Vacuum assistance. See Dr. Andreas Blower note.  Vacuum removed from fetal head at + 4 station at that point , I proceeded with delivery of fetus and placenta. She delivered a viable female infant.  The placenta delivered without problems and was noted to be complete. A perineal and vaginal examination was performed. Lacerations: 1st degree;Vaginal   Laceration was repaired with 3-0 Vicryl Rapide suture. The patient tolerated this well.  Philip Aspen, CNM  07/10/2022 9:50 PM

## 2022-07-03 ENCOUNTER — Encounter: Payer: Self-pay | Admitting: Obstetrics and Gynecology

## 2022-07-03 ENCOUNTER — Ambulatory Visit (INDEPENDENT_AMBULATORY_CARE_PROVIDER_SITE_OTHER): Payer: BC Managed Care – PPO | Admitting: Obstetrics and Gynecology

## 2022-07-03 ENCOUNTER — Ambulatory Visit (INDEPENDENT_AMBULATORY_CARE_PROVIDER_SITE_OTHER): Payer: BC Managed Care – PPO

## 2022-07-03 VITALS — BP 111/79 | HR 106 | Wt 252.5 lb

## 2022-07-03 VITALS — BP 111/79 | HR 106 | Ht 70.0 in | Wt 252.5 lb

## 2022-07-03 DIAGNOSIS — O133 Gestational [pregnancy-induced] hypertension without significant proteinuria, third trimester: Secondary | ICD-10-CM | POA: Diagnosis not present

## 2022-07-03 DIAGNOSIS — O169 Unspecified maternal hypertension, unspecified trimester: Secondary | ICD-10-CM

## 2022-07-03 DIAGNOSIS — O99213 Obesity complicating pregnancy, third trimester: Secondary | ICD-10-CM

## 2022-07-03 DIAGNOSIS — Z3A39 39 weeks gestation of pregnancy: Secondary | ICD-10-CM | POA: Diagnosis not present

## 2022-07-03 DIAGNOSIS — Z3490 Encounter for supervision of normal pregnancy, unspecified, unspecified trimester: Secondary | ICD-10-CM

## 2022-07-03 DIAGNOSIS — Z3403 Encounter for supervision of normal first pregnancy, third trimester: Secondary | ICD-10-CM

## 2022-07-03 LAB — POCT URINALYSIS DIPSTICK OB
Appearance: NORMAL
Bilirubin, UA: NEGATIVE
Blood, UA: NEGATIVE
Glucose, UA: NEGATIVE
Ketones, UA: NEGATIVE
Leukocytes, UA: NEGATIVE
Nitrite, UA: NEGATIVE
Odor: NORMAL
POC,PROTEIN,UA: NEGATIVE
Spec Grav, UA: 1.015 (ref 1.010–1.025)
Urobilinogen, UA: 0.2 E.U./dL
pH, UA: 6 (ref 5.0–8.0)

## 2022-07-03 NOTE — Progress Notes (Signed)
ROB [redacted]w[redacted]d: Doing well. NST. Reports good fetal movement with increased pressure and cramping.

## 2022-07-03 NOTE — Progress Notes (Signed)
ROB: Patient is a 32 y.o. G1P0000 at [redacted]w[redacted]d who presents for routine OB care.  Currently being followed for situational BP elevations vs mild GHTN. Revisited discussion of IOL at patient's request, previously advised that IOL was not necessary but could be considered due to labile BPs. Patient now notes she would be ok with IOL after her due date. IOL scheduled for 07/10/2022. Discussed method of induction. Given handouts on natural cervical ripening agents. NST performed today was reviewed and was found to be reactive.      NONSTRESS TEST INTERPRETATION  INDICATIONS: GHTN vs situational BP elevation  FHR baseline: 145 bpm Accelerations: present Decelerations: absent Variability: moderate Tocometer: irregular contractions with irritability (none detected by patient).   RESULTS:Reactive COMMENTS: None   PLAN: 1. Continue fetal kick counts twice a day. 2. For IOL on 07/10/22.

## 2022-07-04 ENCOUNTER — Encounter: Payer: Self-pay | Admitting: Obstetrics and Gynecology

## 2022-07-10 ENCOUNTER — Inpatient Hospital Stay: Payer: BC Managed Care – PPO | Admitting: Anesthesiology

## 2022-07-10 ENCOUNTER — Inpatient Hospital Stay
Admission: EM | Admit: 2022-07-10 | Discharge: 2022-07-12 | DRG: 805 | Disposition: A | Discharge status: Home / Self Care | Payer: BC Managed Care – PPO | Attending: Obstetrics and Gynecology | Admitting: Obstetrics and Gynecology

## 2022-07-10 ENCOUNTER — Encounter: Payer: Self-pay | Admitting: Obstetrics and Gynecology

## 2022-07-10 ENCOUNTER — Other Ambulatory Visit: Payer: Self-pay

## 2022-07-10 DIAGNOSIS — O99214 Obesity complicating childbirth: Secondary | ICD-10-CM | POA: Diagnosis present

## 2022-07-10 DIAGNOSIS — O9903 Anemia complicating the puerperium: Secondary | ICD-10-CM | POA: Diagnosis not present

## 2022-07-10 DIAGNOSIS — Z3A40 40 weeks gestation of pregnancy: Secondary | ICD-10-CM | POA: Diagnosis not present

## 2022-07-10 DIAGNOSIS — E669 Obesity, unspecified: Secondary | ICD-10-CM | POA: Diagnosis not present

## 2022-07-10 DIAGNOSIS — O9902 Anemia complicating childbirth: Secondary | ICD-10-CM | POA: Diagnosis present

## 2022-07-10 DIAGNOSIS — Z23 Encounter for immunization: Secondary | ICD-10-CM

## 2022-07-10 DIAGNOSIS — Z7982 Long term (current) use of aspirin: Secondary | ICD-10-CM | POA: Diagnosis not present

## 2022-07-10 DIAGNOSIS — O411230 Chorioamnionitis, third trimester, not applicable or unspecified: Secondary | ICD-10-CM | POA: Diagnosis present

## 2022-07-10 DIAGNOSIS — D62 Acute posthemorrhagic anemia: Secondary | ICD-10-CM

## 2022-07-10 DIAGNOSIS — O139 Gestational [pregnancy-induced] hypertension without significant proteinuria, unspecified trimester: Secondary | ICD-10-CM | POA: Diagnosis present

## 2022-07-10 DIAGNOSIS — O411030 Infection of amniotic sac and membranes, unspecified, third trimester, not applicable or unspecified: Secondary | ICD-10-CM | POA: Diagnosis not present

## 2022-07-10 DIAGNOSIS — O480 Post-term pregnancy: Secondary | ICD-10-CM

## 2022-07-10 DIAGNOSIS — Z3490 Encounter for supervision of normal pregnancy, unspecified, unspecified trimester: Secondary | ICD-10-CM

## 2022-07-10 DIAGNOSIS — O134 Gestational [pregnancy-induced] hypertension without significant proteinuria, complicating childbirth: Principal | ICD-10-CM | POA: Diagnosis present

## 2022-07-10 LAB — TYPE AND SCREEN
ABO/RH(D): O POS
Antibody Screen: NEGATIVE

## 2022-07-10 LAB — CBC
HCT: 40 % (ref 36.0–46.0)
Hemoglobin: 13.2 g/dL (ref 12.0–15.0)
MCH: 28.5 pg (ref 26.0–34.0)
MCHC: 33 g/dL (ref 30.0–36.0)
MCV: 86.4 fL (ref 80.0–100.0)
Platelets: 289 10*3/uL (ref 150–400)
RBC: 4.63 MIL/uL (ref 3.87–5.11)
RDW: 14.9 % (ref 11.5–15.5)
WBC: 16.3 10*3/uL — ABNORMAL HIGH (ref 4.0–10.5)
nRBC: 0 % (ref 0.0–0.2)

## 2022-07-10 LAB — ABO/RH: ABO/RH(D): O POS

## 2022-07-10 LAB — RPR: RPR Ser Ql: NONREACTIVE

## 2022-07-10 MED ORDER — OXYCODONE-ACETAMINOPHEN 5-325 MG PO TABS: 1.0000 | ORAL_TABLET | ORAL | Status: DC | PRN

## 2022-07-10 MED ORDER — MISOPROSTOL 50MCG HALF TABLET
50.0000 ug | ORAL_TABLET | Freq: Once | ORAL | Status: AC
  Administered 2022-07-10: 50 ug via ORAL
  Filled 2022-07-10: qty 1

## 2022-07-10 MED ORDER — SODIUM CHLORIDE 0.9 % IV SOLN
INTRAVENOUS | Status: AC
  Filled 2022-07-10: qty 2000

## 2022-07-10 MED ORDER — LIDOCAINE HCL (PF) 1 % IJ SOLN
INTRAMUSCULAR | Status: AC
  Filled 2022-07-10: qty 30

## 2022-07-10 MED ORDER — OXYTOCIN 10 UNIT/ML IJ SOLN
INTRAMUSCULAR | Status: AC
  Filled 2022-07-10: qty 2

## 2022-07-10 MED ORDER — OXYCODONE-ACETAMINOPHEN 5-325 MG PO TABS: 2.0000 | ORAL_TABLET | ORAL | Status: DC | PRN

## 2022-07-10 MED ORDER — WITCH HAZEL-GLYCERIN EX PADS
1.0000 | MEDICATED_PAD | CUTANEOUS | Status: DC | PRN
  Filled 2022-07-10: qty 100

## 2022-07-10 MED ORDER — ONDANSETRON HCL 4 MG PO TABS: 4.0000 mg | ORAL_TABLET | ORAL | Status: DC | PRN

## 2022-07-10 MED ORDER — EPHEDRINE 5 MG/ML INJ: 10.0000 mg | INTRAVENOUS | Status: DC | PRN

## 2022-07-10 MED ORDER — LACTATED RINGERS IV SOLN: INTRAVENOUS | Status: DC

## 2022-07-10 MED ORDER — CLINDAMYCIN PHOSPHATE 900 MG/50ML IV SOLN
900.0000 mg | Freq: Three times a day (TID) | INTRAVENOUS | Status: AC
  Administered 2022-07-10 – 2022-07-11 (×2): 900 mg via INTRAVENOUS
  Filled 2022-07-10 (×2): qty 50

## 2022-07-10 MED ORDER — DIPHENHYDRAMINE HCL 50 MG/ML IJ SOLN: 12.5000 mg | INTRAMUSCULAR | Status: DC | PRN

## 2022-07-10 MED ORDER — ACETAMINOPHEN 325 MG PO TABS
650.0000 mg | ORAL_TABLET | ORAL | Status: DC | PRN
  Administered 2022-07-10 (×2): 650 mg via ORAL
  Filled 2022-07-10 (×2): qty 2

## 2022-07-10 MED ORDER — LACTATED RINGERS IV SOLN: 500.0000 mL | Freq: Once | INTRAVENOUS | Status: DC

## 2022-07-10 MED ORDER — MISOPROSTOL 50MCG HALF TABLET
50.0000 ug | ORAL_TABLET | ORAL | Status: DC | PRN
  Filled 2022-07-10: qty 1

## 2022-07-10 MED ORDER — OXYTOCIN-SODIUM CHLORIDE 30-0.9 UT/500ML-% IV SOLN
1.0000 m[IU]/min | INTRAVENOUS | Status: DC
  Administered 2022-07-10: 2 m[IU]/min via INTRAVENOUS

## 2022-07-10 MED ORDER — PHENYLEPHRINE 80 MCG/ML (10ML) SYRINGE FOR IV PUSH (FOR BLOOD PRESSURE SUPPORT): 80.0000 ug | PREFILLED_SYRINGE | INTRAVENOUS | Status: DC | PRN

## 2022-07-10 MED ORDER — BUPIVACAINE HCL (PF) 0.25 % IJ SOLN
INTRAMUSCULAR | Status: DC | PRN
  Administered 2022-07-10 (×2): 4 mL via EPIDURAL

## 2022-07-10 MED ORDER — MISOPROSTOL 200 MCG PO TABS
ORAL_TABLET | ORAL | Status: AC
  Filled 2022-07-10: qty 4

## 2022-07-10 MED ORDER — IBUPROFEN 600 MG PO TABS
600.0000 mg | ORAL_TABLET | Freq: Four times a day (QID) | ORAL | Status: DC
  Administered 2022-07-10 – 2022-07-12 (×7): 600 mg via ORAL
  Filled 2022-07-10 (×7): qty 1

## 2022-07-10 MED ORDER — OXYTOCIN-SODIUM CHLORIDE 30-0.9 UT/500ML-% IV SOLN
2.5000 [IU]/h | INTRAVENOUS | Status: DC
  Administered 2022-07-10: 2.5 [IU]/h via INTRAVENOUS

## 2022-07-10 MED ORDER — DOCUSATE SODIUM 100 MG PO CAPS
100.0000 mg | ORAL_CAPSULE | Freq: Two times a day (BID) | ORAL | Status: DC
  Administered 2022-07-11 – 2022-07-12 (×3): 100 mg via ORAL
  Filled 2022-07-10 (×3): qty 1

## 2022-07-10 MED ORDER — BENZOCAINE-MENTHOL 20-0.5 % EX AERO
1.0000 | INHALATION_SPRAY | CUTANEOUS | Status: DC | PRN
  Filled 2022-07-10: qty 56

## 2022-07-10 MED ORDER — ONDANSETRON HCL 4 MG/2ML IJ SOLN
4.0000 mg | Freq: Four times a day (QID) | INTRAMUSCULAR | Status: DC | PRN
  Administered 2022-07-10: 4 mg via INTRAVENOUS
  Filled 2022-07-10: qty 2

## 2022-07-10 MED ORDER — FENTANYL CITRATE (PF) 100 MCG/2ML IJ SOLN
50.0000 ug | INTRAMUSCULAR | Status: DC | PRN
  Administered 2022-07-10 (×2): 100 ug via INTRAVENOUS
  Filled 2022-07-10 (×2): qty 2

## 2022-07-10 MED ORDER — OXYTOCIN BOLUS FROM INFUSION
333.0000 mL | Freq: Once | INTRAVENOUS | Status: AC
  Administered 2022-07-10: 333 mL via INTRAVENOUS

## 2022-07-10 MED ORDER — FERROUS SULFATE 325 (65 FE) MG PO TABS
325.0000 mg | ORAL_TABLET | Freq: Every day | ORAL | Status: DC
  Administered 2022-07-11 – 2022-07-12 (×2): 325 mg via ORAL
  Filled 2022-07-10 (×2): qty 1

## 2022-07-10 MED ORDER — TERBUTALINE SULFATE 1 MG/ML IJ SOLN: 0.2500 mg | Freq: Once | INTRAMUSCULAR | Status: DC | PRN

## 2022-07-10 MED ORDER — DIBUCAINE (PERIANAL) 1 % EX OINT
1.0000 | TOPICAL_OINTMENT | CUTANEOUS | Status: DC | PRN
  Filled 2022-07-10: qty 28

## 2022-07-10 MED ORDER — MISOPROSTOL 50MCG HALF TABLET
50.0000 ug | ORAL_TABLET | Freq: Once | ORAL | Status: AC
  Administered 2022-07-10: 50 ug via VAGINAL
  Filled 2022-07-10: qty 1

## 2022-07-10 MED ORDER — LIDOCAINE HCL (PF) 1 % IJ SOLN
INTRAMUSCULAR | Status: DC | PRN
  Administered 2022-07-10: 3 mL

## 2022-07-10 MED ORDER — AMMONIA AROMATIC IN INHA
RESPIRATORY_TRACT | Status: AC
  Filled 2022-07-10: qty 10

## 2022-07-10 MED ORDER — LIDOCAINE-EPINEPHRINE (PF) 1.5 %-1:200000 IJ SOLN
INTRAMUSCULAR | Status: DC | PRN
  Administered 2022-07-10: 3 mL via PERINEURAL

## 2022-07-10 MED ORDER — PRENATAL MULTIVITAMIN CH
1.0000 | ORAL_TABLET | Freq: Every day | ORAL | Status: DC
  Administered 2022-07-11 – 2022-07-12 (×2): 1 via ORAL
  Filled 2022-07-10 (×2): qty 1

## 2022-07-10 MED ORDER — SENNOSIDES-DOCUSATE SODIUM 8.6-50 MG PO TABS: 2.0000 | ORAL_TABLET | ORAL | Status: DC

## 2022-07-10 MED ORDER — OXYTOCIN-SODIUM CHLORIDE 30-0.9 UT/500ML-% IV SOLN
1.0000 m[IU]/min | INTRAVENOUS | Status: DC
  Filled 2022-07-10: qty 500

## 2022-07-10 MED ORDER — SOD CITRATE-CITRIC ACID 500-334 MG/5ML PO SOLN: 30.0000 mL | ORAL | Status: DC | PRN

## 2022-07-10 MED ORDER — SIMETHICONE 80 MG PO CHEW: 80.0000 mg | CHEWABLE_TABLET | ORAL | Status: DC | PRN

## 2022-07-10 MED ORDER — MEASLES, MUMPS & RUBELLA VAC IJ SOLR
0.5000 mL | Freq: Once | INTRAMUSCULAR | Status: AC
  Administered 2022-07-12: 0.5 mL via SUBCUTANEOUS
  Filled 2022-07-10 (×2): qty 0.5

## 2022-07-10 MED ORDER — LACTATED RINGERS IV SOLN: 500.0000 mL | INTRAVENOUS | Status: DC | PRN

## 2022-07-10 MED ORDER — LIDOCAINE HCL (PF) 1 % IJ SOLN: 30.0000 mL | INTRAMUSCULAR | Status: DC | PRN

## 2022-07-10 MED ORDER — COCONUT OIL OIL: 1.0000 | TOPICAL_OIL | Status: DC | PRN

## 2022-07-10 MED ORDER — SODIUM CHLORIDE 0.9 % IV SOLN
2.0000 g | Freq: Four times a day (QID) | INTRAVENOUS | Status: DC
  Administered 2022-07-10: 2 g via INTRAVENOUS

## 2022-07-10 MED ORDER — ONDANSETRON HCL 4 MG/2ML IJ SOLN: 4.0000 mg | INTRAMUSCULAR | Status: DC | PRN

## 2022-07-10 MED ORDER — GENTAMICIN SULFATE 40 MG/ML IJ SOLN
5.0000 mg/kg | INTRAVENOUS | Status: DC
  Administered 2022-07-10: 430 mg via INTRAVENOUS
  Filled 2022-07-10 (×2): qty 10.75

## 2022-07-10 MED ORDER — SENNOSIDES-DOCUSATE SODIUM 8.6-50 MG PO TABS
2.0000 | ORAL_TABLET | ORAL | Status: DC
  Administered 2022-07-11 – 2022-07-12 (×2): 2 via ORAL
  Filled 2022-07-10 (×2): qty 2

## 2022-07-10 MED ORDER — FENTANYL-BUPIVACAINE-NACL 0.5-0.125-0.9 MG/250ML-% EP SOLN
12.0000 mL/h | EPIDURAL | Status: DC | PRN
  Administered 2022-07-10: 12 mL/h via EPIDURAL
  Filled 2022-07-10: qty 250

## 2022-07-10 NOTE — Anesthesia Procedure Notes (Signed)
Epidural Patient location during procedure: OB Start time: 07/10/2022 7:20 AM End time: 07/10/2022 7:26 AM  Staffing Anesthesiologist: Darrin Nipper, MD Resident/CRNA: Hedda Slade, CRNA Performed: resident/CRNA   Preanesthetic Checklist Completed: patient identified, IV checked, site marked, risks and benefits discussed, surgical consent, monitors and equipment checked, pre-op evaluation and timeout performed  Epidural Patient position: sitting Prep: ChloraPrep Patient monitoring: heart rate, continuous pulse ox and blood pressure Approach: midline Location: L3-L4 Injection technique: LOR air  Needle:  Needle type: Tuohy  Needle gauge: 17 G Needle length: 9 cm and 9 Needle insertion depth: 7 cm Catheter type: closed end flexible Catheter size: 19 Gauge Catheter at skin depth: 12 cm Test dose: negative and 1.5% lidocaine with Epi 1:200 K  Assessment Sensory level: T10 Events: blood not aspirated, no cerebrospinal fluid, injection not painful, no injection resistance, no paresthesia and negative IV test  Additional Notes 1 attempt Pt. Evaluated and documentation done after procedure finished. Patient identified. Risks/Benefits/Options discussed with patient including but not limited to bleeding, infection, nerve damage, paralysis, failed block, incomplete pain control, headache, blood pressure changes, nausea, vomiting, reactions to medication both or allergic, itching and postpartum back pain. Confirmed with bedside nurse the patient's most recent platelet count. Confirmed with patient that they are not currently taking any anticoagulation, have any bleeding history or any family history of bleeding disorders. Patient expressed understanding and wished to proceed. All questions were answered. Sterile technique was used throughout the entire procedure. Please see nursing notes for vital signs. Test dose was given through epidural catheter and negative prior to continuing to dose  epidural or start infusion. Warning signs of high block given to the patient including shortness of breath, tingling/numbness in hands, complete motor block, or any concerning symptoms with instructions to call for help. Patient was given instructions on fall risk and not to get out of bed. All questions and concerns addressed with instructions to call with any issues or inadequate analgesia.    Patient tolerated the insertion well without immediate complications.Reason for block:procedure for pain

## 2022-07-10 NOTE — H&P (Signed)
History and Physical   HPI  Renee Lawrence is a 32 y.o. G1P0000 at [redacted]w[redacted]d Estimated Date of Delivery: 07/07/22 by 5 week u/s, who is being admitted for IOL secondary to [facility]. Reports good fetal movement. Denies VB, Ucs, LOF, HA, visual  changes or RUQ pain.   Renee Lawrence started prenatal care at 8 weeks. Had consistent and routine care complicated by labile blood pressures and obesity. Had several occasional elevated Bps which Renee Lawrence thought were directly related to going to visits directly from work where she was stressed. Elevated BP readings have been 120/90, 137/107, 126/90, and most recently at 36wks was 128/96. Was formerly dx with gestational HTN. Pregravid BMI was 38, patient lost a total of 6.12kg throughout the pregnancy. Is rubella non immune.    OB History  OB History  Gravida Para Term Preterm AB Living  1 0 0 0 0 0  SAB IAB Ectopic Multiple Live Births  0 0 0 0 0    # Outcome Date GA Lbr Len/2nd Weight Sex Delivery Anes PTL Lv  1 Current             PROBLEM LIST  Pregnancy complications or risks: Patient Active Problem List   Diagnosis Date Noted   Gestational hypertension 07/10/2022   Elevated blood pressure affecting pregnancy, antepartum 06/29/2022   [redacted] weeks gestation of pregnancy 06/29/2022   Gestational hypertension, third trimester 06/20/2022   [redacted] weeks gestation of pregnancy 06/20/2022   Obesity affecting pregnancy 01/05/2022   Rubella non-immune status, antepartum 12/07/2021   Supervision of normal pregnancy 10/25/2021   Epigastric pain 02/13/2018   Increased risk of breast cancer    Family history of breast cancer     Prenatal labs and studies: ABO, Rh: O Pos Antibody: Neg Rubella: <0.90 (06/07 1656) RPR: Non Reactive (10/19 1004)  HBsAg: Negative (06/07 1656)  HIV: Non Reactive (06/07 1656)  SWN:IOEVOJJK/-- (12/14 1639) H&H:  Hemoglobin  Date Value Ref Range Status  07/10/2022 13.2 12.0 - 15.0 g/dL Final  09/38/1829 93.7  11.1 - 15.9 g/dL Final  16/96/7893 81.0 11.1 - 15.9 g/dL Final  17/51/0258 52.7 11.1 - 15.9 g/dL Final   1hr GTT 782   Past Medical History:  Diagnosis Date   BRCA negative 04/2014   MyRisk neg   Family history of breast cancer    Genetic testing of female 04/2014   My Risk neg   History of Papanicolaou smear of cervix 02/09/2011; 04/30/2014   neg; neg   Increased risk of breast cancer    IBIS=32% 2015   Obesity      Past Surgical History:  Procedure Laterality Date   FOOT SURGERY Left 07/2021   Dr Gearldine Bienenstock   TONSILLECTOMY       Medications    Current Discharge Medication List     CONTINUE these medications which have NOT CHANGED   Details  aspirin 81 MG chewable tablet Chew 81 mg by mouth daily.    Prenatal Vit-Fe Fumarate-FA (MULTIVITAMIN-PRENATAL) 27-0.8 MG TABS tablet Take 1 tablet by mouth daily at 12 noon.         Allergies  Penicillins  Review of Systems  Pertinent items are noted in HPI.  Physical Exam  BP (!) 140/89   Pulse (!) 107   Temp 98 F (36.7 C) (Oral)   Resp 16   LMP 09/08/2021 (Exact Date)   Lungs:  CTA B Cardio: S1S2, RRR Abd: Soft, gravid, NT Presentation: cephalic DTRs: 2+ B  SVE: FT/thick/-3/soft  FHR 130, mod variability, pos accels, no decels Toco occasional UCs   Test Results  Results for orders placed or performed during the hospital encounter of 07/10/22 (from the past 24 hour(s))  CBC     Status: Abnormal   Collection Time: 07/10/22  1:02 AM  Result Value Ref Range   WBC 16.3 (H) 4.0 - 10.5 K/uL   RBC 4.63 3.87 - 5.11 MIL/uL   Hemoglobin 13.2 12.0 - 15.0 g/dL   HCT 40.0 36.0 - 46.0 %   MCV 86.4 80.0 - 100.0 fL   MCH 28.5 26.0 - 34.0 pg   MCHC 33.0 30.0 - 36.0 g/dL   RDW 14.9 11.5 - 15.5 %   Platelets 289 150 - 400 K/uL   nRBC 0.0 0.0 - 0.2 %  Type and screen     Status: None (Preliminary result)   Collection Time: 07/10/22  1:02 AM  Result Value Ref Range   ABO/RH(D) PENDING    Antibody Screen  PENDING    Sample Expiration      07/13/2022,2359 Performed at [facility], [address] [place], [place] [address]      Assessment  G1P0000 at [redacted]w[redacted]d Estimated Date of Delivery: 07/07/22  RNST Not in labor GBS neg  Patient Active Problem List   Diagnosis Date Noted   Gestational hypertension 07/10/2022   Elevated blood pressure affecting pregnancy, antepartum 06/29/2022   [redacted] weeks gestation of pregnancy 06/29/2022   Gestational hypertension, third trimester 06/20/2022   [redacted] weeks gestation of pregnancy 06/20/2022   Obesity affecting pregnancy 01/05/2022   Rubella non-immune status, antepartum 12/07/2021   Supervision of normal pregnancy 10/25/2021   Epigastric pain 02/13/2018   Increased risk of breast cancer    Family history of breast cancer     Plan  1. Admit to L&D   2. EFM per unit policy 3. Labs : T&S, CBC, RPR 4. Plans oral and vaginal cytotec for cervical ripening  Maggie Font, CNM, FNP 07/10/2022 1:32 AM

## 2022-07-10 NOTE — Progress Notes (Signed)
Marcelline Mates, MD applied vacuum at 2000 to assist labor. 0 pop-offs observed. Vacuum removed at 2032.

## 2022-07-10 NOTE — Procedures (Addendum)
OBGYN Attending Note  Called to assess patient at ~ 6:50 p.m. due to limited arrest of descensus after 2 hours of pushing.  Patient also noted to be developing chorioamnionitis, with most recent temp 100.8, initiated on antibiotics recently. Fetal heart rate now borderline tachycardia with 150s-160s.  Maternal heart rate still wnl.    Upon my arrival at approximately 7:20 pm, patient and fetus noted to be stable. Few variable decelerations appreciated with pushing, from baseline down to 110s.  Currently with IUPC and FSE in place. Patient with epidural anesthesia.     Pre-Op Diagnosis: Primiparity, obesity in pregnancy (BMI 35), gestational HTN at term  Post-Op Diagnosis: Same, with vacuum-assisted vaginal delivery  Planned Procedure: Vacuum-assisted operative vaginal delivery  Surgeons: Rubie Maid, MD, Philip Aspen  Anesthesia: Epidural   Operative Delivery Note Verbal consent: obtained from patient.  Risks and benefits discussed in detail.  Risks include, but are not limited to the risks of anesthesia, bleeding, infection, damage to maternal tissues, fetal cephalohematoma.  There is also the risk of inability to effect vaginal delivery of the head, or shoulder dystocia that cannot be resolved by established maneuvers, leading to the need for emergency cesarean section.   Procedure:  With the cervix 10 cm dilated, 100 effaced, +1 station (with caput at +2 station) the Kiwi vacuum was applied by Dr. Marcelline Mates and after good position of the vacuum noted, gentle traction was applied during uterine contraction and maternal pushing resulting in good decent of the fetal head.   0 pop-offs occurred. Use of the vacuum total time was noted to be 22 minutes.  Contractions were every 2-4 minutes. Suction released at the resolution of each contraction.   The fetal head was at +3 station and with excellent maternal effort and pitocin-assisted, she had vaginal delivery Vacuum was removed from the  patient at +4 station. Philip Aspen, CNM, then proceeded with the delivery of fetus and placenta. Please see separate delivery note.        Rubie Maid, MD [facility] at [facility]

## 2022-07-10 NOTE — Progress Notes (Signed)
LABOR NOTE   Renee Lawrence 32 y.o.GP@ at [redacted]w[redacted]d  SUBJECTIVE:  Pt pushing, awaiting Dr. Marcelline Mates evaluation. She is feeling more comfortable after using self PCA bolus for epidural  Analgesia: Epidural  OBJECTIVE:  BP 125/87   Pulse (!) 125   Temp (!) 100.8 F (38.2 C) (Axillary) Comment: MD and CNM present and aware  Resp 20   Ht 5\' 10"  (1.778 m)   Wt 114.5 kg   LMP 09/08/2021 (Exact Date)   SpO2 99%   Breastfeeding Unknown   BMI 36.23 kg/m  Total I/O In: -  Out: 330 [Blood:330]  S CERVIX: 10 cm:  100%:   +1:   :    SVE:   Dilation: 10 Effacement (%): 80 Station: -1 Exam by:: Robin Searing RN CONTRACTIONS: regular, every 2-3 minutes FHR: Fetal heart tracing reviewed. Baseline: 165 bpm, Variability: Good {> 6 bpm), Accelerations: Reactive, and Decelerations: variables  Category II    Labs: Lab Results  Component Value Date   WBC 16.3 (H) 07/10/2022   HGB 13.2 07/10/2022   HCT 40.0 07/10/2022   MCV 86.4 07/10/2022   PLT 289 07/10/2022    ASSESSMENT: 1) Labor curve reviewed.       Progress: Active phase labor.     Membranes: ruptured      2) Chorioamnionitis        Principal Problem:   Gestational hypertension   PLAN: Fetal heart tones 165-170's , maternal axillary temperature elevated. Discussed Chorioamnionitis with pt and her partner. Antibiotics orders. Dr. Marcelline Mates notified.   Philip Aspen, CNM  07/10/2022 9:36 PM

## 2022-07-10 NOTE — Anesthesia Preprocedure Evaluation (Signed)
Anesthesia Evaluation  Patient identified by MRN, date of birth, ID band Patient awake    Reviewed: Allergy & Precautions, H&P , NPO status , Patient's Chart, lab work & pertinent test results  Airway Mallampati: II  TM Distance: >3 FB     Dental no notable dental hx. (+) Teeth Intact   Pulmonary neg pulmonary ROS          Cardiovascular hypertension,      Neuro/Psych negative neurological ROS  negative psych ROS   GI/Hepatic Neg liver ROS,GERD  ,,  Endo/Other  negative endocrine ROS    Renal/GU negative Renal ROS  negative genitourinary   Musculoskeletal   Abdominal   Peds  Hematology negative hematology ROS (+)   Anesthesia Other Findings   Reproductive/Obstetrics (+) Pregnancy                             Anesthesia Physical Anesthesia Plan  ASA: 2  Anesthesia Plan: Epidural   Post-op Pain Management:    Induction:   PONV Risk Score and Plan:   Airway Management Planned:   Additional Equipment:   Intra-op Plan:   Post-operative Plan:   Informed Consent: I have reviewed the patients History and Physical, chart, labs and discussed the procedure including the risks, benefits and alternatives for the proposed anesthesia with the patient or authorized representative who has indicated his/her understanding and acceptance.       Plan Discussed with: Anesthesiologist and CRNA  Anesthesia Plan Comments:        Anesthesia Quick Evaluation

## 2022-07-10 NOTE — Progress Notes (Signed)
LABOR NOTE   Renee Lawrence 32 y.o.GP@ at [redacted]w[redacted]d  SUBJECTIVE:  Pushing  Analgesia: Epidural  OBJECTIVE:  BP 125/87   Pulse (!) 125   Temp (!) 100.8 F (38.2 C) (Axillary) Comment: MD and CNM present and aware  Resp 20   Ht 5\' 10"  (1.778 m)   Wt 114.5 kg   LMP 09/08/2021 (Exact Date)   SpO2 99%   Breastfeeding Unknown   BMI 36.23 kg/m  Total I/O In: -  Out: 330 [Blood:330]   CERVIX: 10/100%/+1  SVE:   Dilation: 10 Effacement (%): 80 Station: -1 Exam by:: Robin Searing RN CONTRACTIONS: regular, every 3-4 minutes FHR: Fetal heart tracing reviewed. Baseline: 160 bpm, Variability: Good {> 6 bpm), Accelerations: Reactive, and Decelerations: variable with pushing  Category II    Labs: Lab Results  Component Value Date   WBC 16.3 (H) 07/10/2022   HGB 13.2 07/10/2022   HCT 40.0 07/10/2022   MCV 86.4 07/10/2022   PLT 289 07/10/2022    ASSESSMENT: 1) Labor curve reviewed.       Progress: Active phase labor.     Membranes: ruptured           2) Chorioamnionitis   Principal Problem:   Gestational hypertension   PLAN: Dr. Marcelline Mates at the bedside to evaluate for vacuum assisted delivery vs primary cesarean section . PT and her partner counseled by Dr. Marcelline Mates risk vs benefits for primary cesarean section/ vacuum assisted delivery. Pt and her partner agree to vacuum assisted attempt.    Philip Aspen, CNM  07/10/2022 9:40 PM

## 2022-07-10 NOTE — Progress Notes (Signed)
LABOR NOTE   Renee Lawrence 32 y.o.GP@ at [redacted]w[redacted]d  SUBJECTIVE:  Epidural, uncomfortable. Has used bolus several times, she is pushing well. She is tearful and states she "wants the baby out". She has been pushing x 2 hours.  Analgesia: Epidural  OBJECTIVE:  BP (!) 136/94   Pulse (!) 107   Temp (!) 100.9 F (38.3 C) (Axillary)   Resp 20   Ht 5\' 10"  (1.778 m)   Wt 114.5 kg   LMP 09/08/2021 (Exact Date)   SpO2 99%   BMI 36.23 kg/m  No intake/output data recorded.   CERVIX: 10 cm/100%/ +1 station SVE:   Dilation: 10 Effacement (%): 80 Station: -1 Exam by:: B. Ricky Ala RN CONTRACTIONS: regular, every 2  minutes FHR: Fetal heart tracing reviewed. Baseline: 150 bpm, Variability: Good {> 6 bpm), Accelerations: Reactive, and Decelerations: repetitive variables with pushing  Category II    Labs: Lab Results  Component Value Date   WBC 16.3 (H) 07/10/2022   HGB 13.2 07/10/2022   HCT 40.0 07/10/2022   MCV 86.4 07/10/2022   PLT 289 07/10/2022    ASSESSMENT: 1) Labor curve reviewed.       Progress: Active phase labor.     Membranes: ruptured            Principal Problem:   Gestational hypertension   PLAN: Consulted Dr. Marcelline Mates for assistance vacuum. Dr. Marcelline Mates to come in and evaluate pt.    Philip Aspen, CNM  07/10/2022 6:53 PM

## 2022-07-10 NOTE — Progress Notes (Signed)
LABOR NOTE   Renee Lawrence 32 y.o.GP@ at [redacted]w[redacted]d  SUBJECTIVE:  Feeling some pressure Analgesia: Epidural  OBJECTIVE:  BP (!) 136/94   Pulse (!) 107   Temp 98 F (36.7 C) (Oral)   Resp 20   Ht 5\' 10"  (1.778 m)   Wt 114.5 kg   LMP 09/08/2021 (Exact Date)   SpO2 99%   BMI 36.23 kg/m  No intake/output data recorded.  She has shown cervical change. CERVIX: 10 cm:  100%:   -1:    SVE:   Dilation: 3.5 Effacement (%): 80 Station: -2 Exam by:: Philip Aspen CNM CONTRACTIONS: regular, every 1-3 minutes FHR: Fetal heart tracing reviewed. Baseline: 140 bpm, Variability: Good {> 6 bpm), Accelerations: Reactive, and Decelerations: early and variable  Category II    Labs: Lab Results  Component Value Date   WBC 16.3 (H) 07/10/2022   HGB 13.2 07/10/2022   HCT 40.0 07/10/2022   MCV 86.4 07/10/2022   PLT 289 07/10/2022    ASSESSMENT: 1) Labor curve reviewed.       Progress: Active phase labor.     Membranes: ruptured   Principal Problem:   Gestational hypertension Obesity   PLAN: Start pushing   Philip Aspen, [place]  07/10/2022 3:36 PM

## 2022-07-10 NOTE — Progress Notes (Signed)
LABOR NOTE   DYLANA SHAW 32 y.o.GP@ at [redacted]w[redacted]d  SUBJECTIVE:  Pushing , starting to get fatigued.  Analgesia: Epidural  OBJECTIVE:  BP (!) 136/94   Pulse (!) 107   Temp (!) 100.9 F (38.3 C) (Axillary)   Resp 20   Ht 5\' 10"  (1.778 m)   Wt 114.5 kg   LMP 09/08/2021 (Exact Date)   SpO2 99%   BMI 36.23 kg/m  No intake/output data recorded.   CERVIX: 10 cm/100% 0 to +1 station SVE:   Dilation: 10 Effacement (%): 80 Station: -1 Exam by:: B. Ricky Ala RN CONTRACTIONS: regular, every 2-3 minutes FHR: Fetal heart tracing reviewed. Baseline: 150 bpm, Variability: Good {> 6 bpm), Accelerations: Reactive, and Decelerations: variable with pushing  Category II    Labs: Lab Results  Component Value Date   WBC 16.3 (H) 07/10/2022   HGB 13.2 07/10/2022   HCT 40.0 07/10/2022   MCV 86.4 07/10/2022   PLT 289 07/10/2022    ASSESSMENT: 1) Labor curve reviewed.       Progress: Active phase labor.     Membranes: ruptured            Principal Problem:   Gestational hypertension   PLAN: Pt axillary temperature 100.9 , oral 98.3 F. FHT normal, pt does not feel warm with vaginal exam. Dr. Marcelline Mates consulted. Given fetal heart rate normal and pt not feeling warm on vaginal exam , not calling chorioamnionits at this time. Recommend IV antibiotics prophylactic. Orders placed  for ampicillin. PT has allergy listed. Reviewed reaction with Dr. Marcelline Mates and pt and . Recommendation to proceed with ampicillin.    Philip Aspen, CNM  07/10/2022 6:58 PM

## 2022-07-10 NOTE — Progress Notes (Signed)
LABOR NOTE   Renee Lawrence 32 y.o.GP@ at [redacted]w[redacted]d  SUBJECTIVE:  Comfortable with epidural  Analgesia: Epidural  OBJECTIVE:  BP 127/86   Pulse 91   Temp 98.1 F (36.7 C) (Oral)   Resp 20   Ht 5\' 10"  (1.778 m)   Wt 114.5 kg   LMP 09/08/2021 (Exact Date)   SpO2 99%   BMI 36.23 kg/m  No intake/output data recorded.  She has shown cervical change. CERVIX: 3-4 cm:  80%:   -2:   posterior:   firm SVE:   Dilation: 1.5 Effacement (%): 50 Station: -3 Exam by:: Brooke Rebelo, RN CONTRACTIONS: regular, every 2-3 minutes FHR: Fetal heart tracing reviewed. Baseline: 140 bpm, Variability: Good {> 6 bpm), Accelerations: Reactive, and Decelerations: variables while flat on her back for SVE/foley placement  Category II    Labs: Lab Results  Component Value Date   WBC 16.3 (H) 07/10/2022   HGB 13.2 07/10/2022   HCT 40.0 07/10/2022   MCV 86.4 07/10/2022   PLT 289 07/10/2022    ASSESSMENT: 1) Labor curve reviewed.       Progress: Early latent labor.     Membranes: ruptured, blood tinged     IUPC and FSE placed , due to difficulty tracing         Principal Problem:   Gestational hypertension Obesity affecting pregnancy   PLAN: continue present management, pitocin prn.   Philip Aspen, CNM  07/10/2022 8:49 AM

## 2022-07-10 NOTE — Progress Notes (Signed)
LABOR NOTE   Renee Lawrence 32 y.o.GP@ at [redacted]w[redacted]d  SUBJECTIVE:  Was feeling some discomfort, epidural bolus by pt , now feeling better. Analgesia: Epidural  OBJECTIVE:  BP 123/89   Pulse 85   Temp 98 F (36.7 C) (Oral)   Resp 20   Ht 5\' 10"  (1.778 m)   Wt 114.5 kg   LMP 09/08/2021 (Exact Date)   SpO2 99%   BMI 36.23 kg/m  No intake/output data recorded.  CERVIX: Per RN exam 4 cm /80%/-2 approximately  1137 SVE:   Dilation: 3.5 Effacement (%): 80 Station: -2 Exam by:: Philip Aspen CNM CONTRACTIONS: regular, every 1-3 minutes, adequate MVU's FHR: Fetal heart tracing reviewed. Baseline: 130 bpm, Variability: Good {> 6 bpm), Accelerations: Reactive, and Decelerations: Early Category I    Labs: Lab Results  Component Value Date   WBC 16.3 (H) 07/10/2022   HGB 13.2 07/10/2022   HCT 40.0 07/10/2022   MCV 86.4 07/10/2022   PLT 289 07/10/2022    ASSESSMENT: 1) Labor curve reviewed.       Progress: Early latent labor.     Membranes: ruptured     IUPC/FSE in place       Principal Problem:   Gestational hypertension Obesity Body mass index is 36.23 kg/m.   PLAN: continue present management   Philip Aspen, CNM  07/10/2022 11:56 AM

## 2022-07-11 LAB — CBC
HCT: 32.5 % — ABNORMAL LOW (ref 36.0–46.0)
Hemoglobin: 10.9 g/dL — ABNORMAL LOW (ref 12.0–15.0)
MCH: 29.1 pg (ref 26.0–34.0)
MCHC: 33.5 g/dL (ref 30.0–36.0)
MCV: 86.9 fL (ref 80.0–100.0)
Platelets: 242 10*3/uL (ref 150–400)
RBC: 3.74 MIL/uL — ABNORMAL LOW (ref 3.87–5.11)
RDW: 14.9 % (ref 11.5–15.5)
WBC: 22.3 10*3/uL — ABNORMAL HIGH (ref 4.0–10.5)
nRBC: 0 % (ref 0.0–0.2)

## 2022-07-11 MED ORDER — ACETAMINOPHEN 325 MG PO TABS
650.0000 mg | ORAL_TABLET | Freq: Four times a day (QID) | ORAL | Status: DC | PRN
  Administered 2022-07-11 – 2022-07-12 (×4): 650 mg via ORAL
  Filled 2022-07-11 (×4): qty 2

## 2022-07-11 NOTE — Lactation Note (Addendum)
This note was copied from a baby's chart. Lactation Consultation Note  Patient Name: Renee Lawrence [id number] Date: 07/11/2022 Reason for consult: Initial assessment;Primapara;Term;Breastfeeding assistance Age:33 hours  Maternal Data This is mom's 1st baby, vaginal , vacuum extractor. Mom with history of gestational HTN and obesity.  Today at the initial visit mom reports she has been using the nipple shield to latch the baby. She does attempt to latch baby directly to the breast and if baby doesn't latch she uses the nipple shield. Per mom baby has breastfed and latched with use of the nipple shield for the last 3 feeds. Baby has been sleepy since the last feeding 3 hours ago. Parents report they have been observing baby for feeding cues. Baby clothed and sleeping.  Has patient been taught Hand Expression?: Yes Does the patient have breastfeeding experience prior to this delivery?: No  Feeding Mother's Current Feeding Choice: Breast Milk Recommended to take baby out of clothing . Baby did awaken . Mom attempted to latch the baby , baby would briefly latch and detach. Mom used the nipple shield to latch the baby. Baby asleep with the shield in her mouth no active sucking observed. Mom attempted at both breasts. Baby with large stool diaper. Dad instructed on diaper changing. Baby asleep and did not breastfeed. Reviewed with mom tips and strategies to maximize positioning and latch technique both with and without the nipple shield. Mom post pumped after breastfeeding attempt with shield. Mom had a few drops noted on the breast shield.    Lactation Tools Discussed/Used Tools: Nipple [place] Fuel;Pump (mom has been using nipple shields to latch baby. Mom attempts without the shield and if baby does not latch baby mom applies the nipple shield.) Nipple shield size: 20 Breast pump type: Double-Electric Breast Pump Pump Education: Setup, frequency, and cleaning;Milk Storage Reason for Pumping:  mom using nipple shields to latch baby. (Discussed with mom when using a nipple shield in the early post partumperiod  the shield can act as a barrier decreasing stimulation to the nipple. Recommended mom post pump 4-6 times/24 hours. Once milk supply is established no longer need to post pump.) Pumping frequency: post pump 4-6 times/24 hours with use of shield until milk supply is established or  if after 24 hours of life baby is not consistentlt latching and breastfeeding at a feeding session. Pumped volume: 0 mL (a few drops on breast flange.)  Interventions Interventions: Breast feeding basics reviewed;Assisted with latch;Breast massage;Hand express;Support pillows;Position options;Adjust position;Breast compression;Education If after 24 hours of life baby will not latch and feed recommended mom pump/ hand express and provide expressed colostrum with alternate feeding method.Parents in agreement with plan.  Discharge Pump: Personal  Consult Status Consult Status: Follow-up Date: 07/12/22 Follow-up type: In-patient  Update provided to care nurse.  Renee Lawrence 07/11/2022, 5:48 PM

## 2022-07-11 NOTE — Discharge Summary (Signed)
Postpartum Discharge Summary  Date of Service updated 07/12/2022     Patient Name: Renee Lawrence DOB: 1991/01/29 MRN: [id number]  Date of admission: 07/10/2022 Delivery date:07/10/2022  Delivering provider: Philip Aspen  Date of discharge: 07/12/2022  Admitting diagnosis: Gestational hypertension [O13.9] Intrauterine pregnancy: [redacted]w[redacted]d     Secondary diagnosis:  Principal Problem:   Gestational hypertension Active Problems:   Postpartum care following vaginal delivery  Additional problems: obesity, chorioamnionitis     Discharge diagnosis: Term Pregnancy Delivered, Vaginal vacuum delivery                                               Post partum procedures: none Augmentation: AROM, Pitocin, and Cytotec Complications: Intrauterine Inflammation or infection (Chorioamniotis)  Hospital course: Induction of Labor With Vaginal Delivery   32 y.o. yo G1P1001 at [redacted]w[redacted]d was admitted to the hospital 07/10/2022 for induction of labor.  Indication for induction: Gestational hypertension.  Patient had an labor course complicated by chorioamnionitis  Membrane Rupture Time/Date: 8:43 AM ,07/10/2022   Delivery Method:Vaginal, Vacuum (Extractor)  Episiotomy: None  Lacerations:  1st degree;Vaginal  Details of delivery can be found in separate delivery note.  Patient had a postpartum course with no complications Patient is discharged home  in stable condition.  Newborn Data: Birth date:07/10/2022  Birth time:8:36 PM  Gender:Female  Living status:Living  Apgars:8 ,9  Weight:3830 g   Magnesium Sulfate received: No BMZ received: No Rhophylac:N/A MMR:Yes T-DaP:Given prenatally Flu: No Transfusion:No  Physical exam  Vitals:   07/11/22 1558 07/11/22 1936 07/11/22 2301 07/12/22 0833  BP: 115/68 128/69 107/69 120/75  Pulse: 99 (!) 105 95 (!) 101  Resp: 18 18 18 20   Temp: 97.9 F (36.6 C) 97.7 F (36.5 C) 98.1 F (36.7 C) 97.7 F (36.5 C)  TempSrc: Oral Oral Oral Oral  SpO2: 99% 98%  98% 97%  Weight:      Height:       General: alert, cooperative, and no distress Lochia: appropriate Uterine Fundus: firm Incision: Healing well with no significant drainage DVT Evaluation: No evidence of DVT seen on physical exam. Negative Homan's sign. No cords or calf tenderness. Labs: Lab Results  Component Value Date   WBC 22.3 (H) 07/11/2022   HGB 10.9 (L) 07/11/2022   HCT 32.5 (L) 07/11/2022   MCV 86.9 07/11/2022   PLT 242 07/11/2022      Latest Ref Rng & Units 06/14/2022    4:23 PM  CMP  Glucose 70 - 99 mg/dL 66   BUN 6 - 20 mg/dL 5   Creatinine 0.57 - 1.00 mg/dL 0.56   Sodium 134 - 144 mmol/L 136   Potassium 3.5 - 5.2 mmol/L 4.1   Chloride 96 - 106 mmol/L 101   CO2 20 - 29 mmol/L 20   Calcium 8.7 - 10.2 mg/dL 9.4   Total Protein 6.0 - 8.5 g/dL 6.2   Total Bilirubin 0.0 - 1.2 mg/dL 0.3   Alkaline Phos 44 - 121 IU/L 129   AST 0 - 40 IU/L 14   ALT 0 - 32 IU/L 13    Edinburgh Score:    07/11/2022    1:00 PM  Edinburgh Postnatal Depression Scale Screening Tool  I have been able to laugh and see the funny side of things. 0  I have looked forward with enjoyment to things. 0  I have blamed myself unnecessarily when things went wrong. 1  I have been anxious or worried for no good reason. 1  I have felt scared or panicky for no good reason. 0  Things have been getting on top of me. 0  I have been so unhappy that I have had difficulty sleeping. 0  I have felt sad or miserable. 0  I have been so unhappy that I have been crying. 0  The thought of harming myself has occurred to me. 0  Edinburgh Postnatal Depression Scale Total 2      After visit meds:  Allergies as of 07/12/2022       Reactions   Penicillins Swelling        Medication List     STOP taking these medications    aspirin 81 MG chewable tablet       TAKE these medications    ibuprofen 600 MG tablet Commonly known as: ADVIL Take 1 tablet (600 mg total) by mouth every 6 (six)  hours.   multivitamin-prenatal 27-0.8 MG Tabs tablet Take 1 tablet by mouth daily at 12 noon.         Discharge home in stable condition Infant Feeding: Breast Infant Disposition:home with mother Discharge instruction: per After Visit Summary and Postpartum booklet. Activity: Advance as tolerated. Pelvic rest for 6 weeks.  Diet: routine diet Anticipated Birth Control: POPs Postpartum Appointment:6 weeks and 2 weeks virtually Additional Postpartum F/U: Postpartum Depression checkup Future Appointments:No future appointments. Follow up Visit:  Follow-up Information     Philip Aspen, CNM Follow up in 6 week(s).   Specialties: Certified Nurse Midwife, Radiology Why: Please call the office and arrange a virtual visit with Philip Aspen for 2 weeks post delivery. also make an appointment for a physical at 6 weeks post partum. If you plan on getting an IUD or Nexplanon, please tell the office scheduler Contact information: [address] [place] [address] [phone]                     07/12/2022 Imagene Riches, CNM

## 2022-07-11 NOTE — Anesthesia Postprocedure Evaluation (Signed)
Anesthesia Post Note  Patient: Renee Lawrence  Procedure(s) Performed: AN AD HOC LABOR EPIDURAL  Patient location during evaluation: Mother Baby Anesthesia Type: Epidural Level of consciousness: awake and alert Pain management: pain level controlled Vital Signs Assessment: post-procedure vital signs reviewed and stable Respiratory status: spontaneous breathing, nonlabored ventilation and respiratory function stable Cardiovascular status: stable Postop Assessment: no headache, no backache and epidural receding Anesthetic complications: no Comments: Patient unable to void.  In and out cath done.   No notable events documented.   Last Vitals:  Vitals:   07/11/22 0025 07/11/22 0309  BP: 122/82 113/80  Pulse: (!) 105 100  Resp: 20 18  Temp: 36.5 C 36.4 C  SpO2: 96% 97%    Last Pain:  Vitals:   07/11/22 0309  TempSrc: Oral  PainSc:                  Hedda Slade

## 2022-07-11 NOTE — Progress Notes (Signed)
Subjective:  Doing well postpartum day 1; she is ambulating and voiding without difficulty, she is tolerating a regular diet although decreased appetite so far, her pain is controlled with PO medications. She reports breastfeeding is going well.   Objective:  Vital signs in last 24 hours: Temp:  [97.6 F (36.4 C)-101.2 F (38.4 C)] 98 F (36.7 C) (01/10 1159) Pulse Rate:  [81-127] 81 (01/10 1159) Resp:  [18-20] 20 (01/10 1159) BP: (93-137)/(60-94) 93/60 (01/10 1159) SpO2:  [96 %-99 %] 96 % (01/10 1159)    General: NAD Pulmonary: no increased work of breathing Abdomen: non-distended, non-tender, fundus firm at level of umbilicus Extremities: no edema, no erythema, no tenderness  Results for orders placed or performed during the hospital encounter of 07/10/22 (from the past 72 hour(s))  CBC     Status: Abnormal   Collection Time: 07/10/22  1:02 AM  Result Value Ref Range   WBC 16.3 (H) 4.0 - 10.5 K/uL   RBC 4.63 3.87 - 5.11 MIL/uL   Hemoglobin 13.2 12.0 - 15.0 g/dL   HCT 40.0 36.0 - 46.0 %   MCV 86.4 80.0 - 100.0 fL   MCH 28.5 26.0 - 34.0 pg   MCHC 33.0 30.0 - 36.0 g/dL   RDW 14.9 11.5 - 15.5 %   Platelets 289 150 - 400 K/uL   nRBC 0.0 0.0 - 0.2 %    Comment: Performed at [facility], [address] [place], [place] [address]  Type and screen     Status: None   Collection Time: 07/10/22  1:02 AM  Result Value Ref Range   ABO/RH(D) O POS    Antibody Screen NEG    Sample Expiration      07/13/2022,2359 Performed at [facility], [address] [place], [place] [address]   RPR     Status: None   Collection Time: 07/10/22  1:02 AM  Result Value Ref Range   RPR Ser Ql NON REACTIVE NON REACTIVE    Comment: Performed at [facility], [address] [place], [place] [address]  ABO/Rh     Status: None   Collection Time: 07/10/22  2:40 AM  Result Value Ref Range   ABO/RH(D)      O POS Performed at [facility], [address] [place], [place] [address]   CBC     Status: Abnormal   Collection Time: 07/11/22  5:58 AM  Result Value Ref Range   WBC 22.3 (H) 4.0 - 10.5 K/uL   RBC 3.74 (L) 3.87 - 5.11 MIL/uL   Hemoglobin 10.9 (L) 12.0 - 15.0 g/dL   HCT 32.5 (L) 36.0 - 46.0 %   MCV 86.9 80.0 - 100.0 fL   MCH 29.1 26.0 - 34.0 pg   MCHC 33.5 30.0 - 36.0 g/dL   RDW 14.9 11.5 - 15.5 %   Platelets 242 150 - 400 K/uL   nRBC 0.0 0.0 - 0.2 %    Comment: Performed at [facility], [address] [place], [place] [address]    Assessment:   32 y.o. G1P1001 postpartum day # 1, lactating  Plan:    1) Acute blood loss anemia - hemodynamically stable and asymptomatic - po ferrous sulfate  2) Blood Type --/--/O POS Performed at [facility], [address] [place],  [address]  [phone]/09 [phone]) / Charlynn Grimes <0.90 (06/07 1656) / Varicella Immune  3) TDAP status up to date  4) Feeding plan breast  5)  Education given regarding options for contraception, as well as compatibility with breast feeding if applicable.  Patient plans on  undecided  for contraception.  6) Disposition: continue current care   Rod Can, [facility] 07/11/2022, 1:27 PM

## 2022-07-12 MED ORDER — IBUPROFEN 600 MG PO TABS: 600.0000 mg | ORAL_TABLET | Freq: Four times a day (QID) | ORAL | 0 refills | Status: DC

## 2022-07-12 NOTE — Lactation Note (Signed)
This note was copied from a baby's chart. Lactation Consultation Note  Patient Name: [name] [id number] Date: 07/12/2022 Reason for consult: Follow-up assessment;Term;Primapara Age:32 hours  Maternal Data This is mom's 1st baby, vaginal/vacuum extractor. Mom with history of gestational HTN and obesity.  On follow-up today mom reports baby is now latching and feeding well without the nipple shield and has cluster fed through the night. Per mom she has been able to latch the baby better with a blanket roll placed under her breast to elevate her breast(care nurse assisted her with this last night).   Has patient been taught Hand Expression?: Yes Does the patient have breastfeeding experience prior to this delivery?: No  Feeding Mother's Current Feeding Choice: Breast Milk  Interventions Interventions: Education  Discharge Discharge Education: Warning signs for feeding baby;Engorgement and breast care;Outpatient recommendation Pump: Personal  Consult Status Consult Status: Complete Date: 07/12/22 Follow-up type: In-patient  Update provided to care nurse.  Jonna Bran Aldridge 07/12/2022, 10:45 AM

## 2022-07-12 NOTE — Progress Notes (Signed)
Patient discharged. Discharge instructions given. Patient verbalizes understanding. Transported by axillary. 

## 2022-07-13 LAB — SURGICAL PATHOLOGY

## 2022-07-30 ENCOUNTER — Encounter: Payer: Self-pay | Admitting: Certified Nurse Midwife

## 2022-07-30 ENCOUNTER — Telehealth (INDEPENDENT_AMBULATORY_CARE_PROVIDER_SITE_OTHER): Payer: BC Managed Care – PPO | Admitting: Certified Nurse Midwife

## 2022-07-30 DIAGNOSIS — Z1331 Encounter for screening for depression: Secondary | ICD-10-CM

## 2022-07-30 NOTE — Progress Notes (Signed)
Virtual Visit via Video Note  I connected with Renee Lawrence on 07/30/22 at 11:15 AM EST by a video enabled telemedicine application and verified that I am speaking with the correct person using two identifiers.  Location: Patient: at home Provider: at the office    I discussed the limitations of evaluation and management by telemedicine and the availability of in person appointments. The patient expressed understanding and agreed to proceed.  History of Present Illness: 2 weeks status post SVD 07/10/22 , history of gestational hypertension   Observations/Objective: Doing well over all. She state she bleeding has stopped. She denies any pain. She is nursing and denies any concerns. She states her mood is good, she has had help at home.   She has had some discomfort with hemorrhoids. She is using the medications from the hospital which is helping. Discussed follow up 6 wk pp with general surgeon to discuss options.   Assessment and Plan:  Edinburgh Postnatal Depression Scale - 07/30/22 1102       Edinburgh Postnatal Depression Scale:  In the Past 7 Days   I have been able to laugh and see the funny side of things. 0    I have looked forward with enjoyment to things. 0    I have blamed myself unnecessarily when things went wrong. 1    I have been anxious or worried for no good reason. 2    I have felt scared or panicky for no good reason. 0    Things have been getting on top of me. 0    I have been so unhappy that I have had difficulty sleeping. 0    I have felt sad or miserable. 0    I have been so unhappy that I have been crying. 0    The thought of harming myself has occurred to me. 0    Edinburgh Postnatal Depression Scale Total 3            Negative depression screen.   Follow Up Instructions: 4 weeks in the office or prn    I discussed the assessment and treatment plan with the patient. The patient was provided an opportunity to ask questions and all were  answered. The patient agreed with the plan and demonstrated an understanding of the instructions.   The patient was advised to call back or seek an in-person evaluation if the symptoms worsen or if the condition fails to improve as anticipated.  I provided 8 minutes of non-face-to-face time during this encounter.   Philip Aspen, CNM

## 2022-07-30 NOTE — Progress Notes (Signed)
Edinburgh Postnatal Depression Scale - 07/30/22 1102       Edinburgh Postnatal Depression Scale:  In the Past 7 Days   I have been able to laugh and see the funny side of things. 0    I have looked forward with enjoyment to things. 0    I have blamed myself unnecessarily when things went wrong. 1    I have been anxious or worried for no good reason. 2    I have felt scared or panicky for no good reason. 0    Things have been getting on top of me. 0    I have been so unhappy that I have had difficulty sleeping. 0    I have felt sad or miserable. 0    I have been so unhappy that I have been crying. 0    The thought of harming myself has occurred to me. 0    Edinburgh Postnatal Depression Scale Total 3

## 2022-08-17 ENCOUNTER — Other Ambulatory Visit: Payer: Self-pay | Admitting: Certified Nurse Midwife

## 2022-08-17 ENCOUNTER — Encounter: Payer: Self-pay | Admitting: Certified Nurse Midwife

## 2022-08-17 MED ORDER — HYDROCORTISONE ACETATE 25 MG RE SUPP: 25.0000 mg | Freq: Two times a day (BID) | RECTAL | 0 refills | Status: DC

## 2022-08-20 ENCOUNTER — Other Ambulatory Visit: Payer: Self-pay | Admitting: Certified Nurse Midwife

## 2022-08-20 DIAGNOSIS — K648 Other hemorrhoids: Secondary | ICD-10-CM

## 2022-08-29 ENCOUNTER — Ambulatory Visit (INDEPENDENT_AMBULATORY_CARE_PROVIDER_SITE_OTHER): Payer: BC Managed Care – PPO | Admitting: Certified Nurse Midwife

## 2022-08-29 ENCOUNTER — Encounter: Payer: Self-pay | Admitting: Certified Nurse Midwife

## 2022-08-29 MED ORDER — NORETHINDRONE 0.35 MG PO TABS: 1.0000 | ORAL_TABLET | Freq: Every day | ORAL | 11 refills | Status: DC

## 2022-08-29 NOTE — Patient Instructions (Signed)

## 2022-08-29 NOTE — Progress Notes (Signed)
Subjective:    Renee Lawrence is a 32 y.o. G20P1001 Caucasian female who presents for a postpartum visit. She is 6 weeks postpartum following a spontaneous vaginal delivery with vacuum assistance at 40.3 gestational weeks. Anesthesia: epidural. I have fully reviewed the prenatal and intrapartum course. Postpartum course has been normal. Baby's course has been normal . Baby is feeding by breast. Bleeding no bleeding. Bowel function is  has hemorroids, has referrral to GI for further evaluation due to rectal bleeding.  . Bladder function is normal. Patient is not sexually active. Last sexual activity: prior to delivery. Contraception method is oral progesterone-only contraceptive. Postpartum depression screening: negative. Score 4.  Last pap 11/08/2021 and was negative.  The following portions of the patient's history were reviewed and updated as appropriate: allergies, current medications, past medical history, past surgical history and problem list.  Review of Systems Pertinent items are noted in HPI.   Vitals:   08/29/22 1105  BP: 112/77  Pulse: 78  Weight: 234 lb 14.4 oz (106.5 kg)   No LMP recorded.  Objective:   General:  alert, cooperative and no distress   Breasts:  deferred, no complaints  Lungs: clear to auscultation bilaterally  Heart:  regular rate and rhythm  Abdomen: soft, nontender   Vulva: normal  Vagina: normal vagina  Cervix:  closed  Corpus: Well-involuted  Adnexa:  Non-palpable  Rectal Exam:  hemorrhoids        Assessment:   Postpartum exam 6 wks s/p VD with vacuum Breast feeding Depression screening Contraception counseling   Plan:  : oral progesterone-only contraceptive Follow up in: 9 months for annual or earlier if needed  Philip Aspen, CNM

## 2022-09-25 ENCOUNTER — Encounter: Payer: Self-pay | Admitting: Certified Nurse Midwife

## 2022-11-09 IMAGING — US US  BREAST BX W/ LOC DEV 1ST LESION IMG BX SPEC US GUIDE*R*
1 series · 10 of 10 positions shown · non-contrast
Comparison: None Available.
COMPARISON: None Available.

Addendum:
CLINICAL DATA: 30-year-old female presenting for biopsy of a mass
in the right breast.

EXAM:
ULTRASOUND GUIDED RIGHT BREAST CORE NEEDLE BIOPSY

[Series 1: us breast bx w/ loc dev 1st lesion img bx spec us  · 0.05mm/px · 10 of 10 slices shown]
[im 1/10]
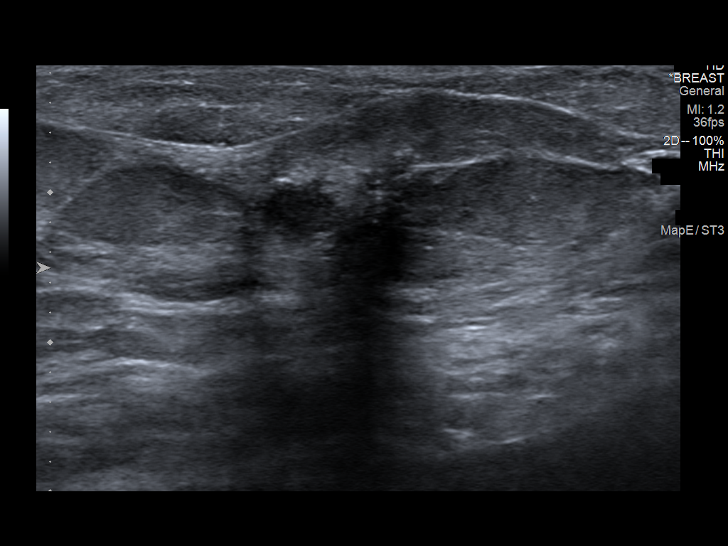
[im 2/10]
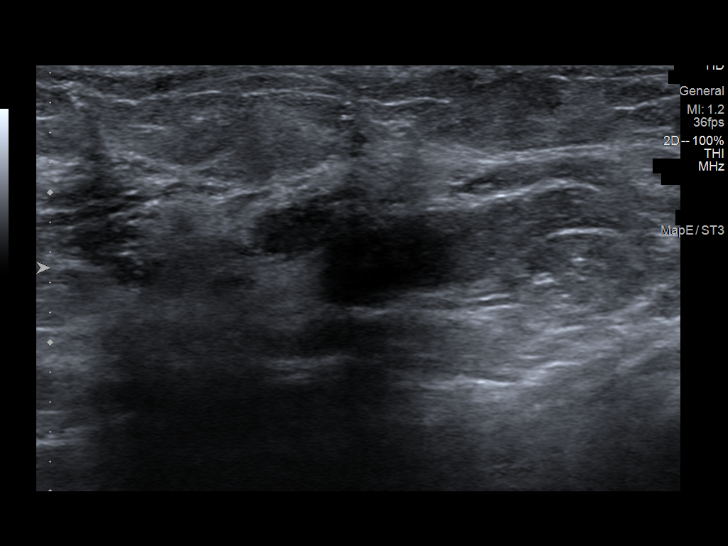
[im 3/10]
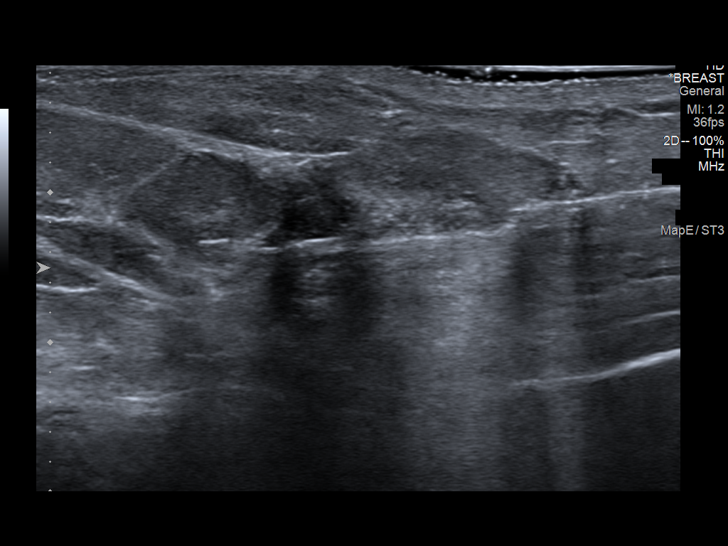
[im 4/10]
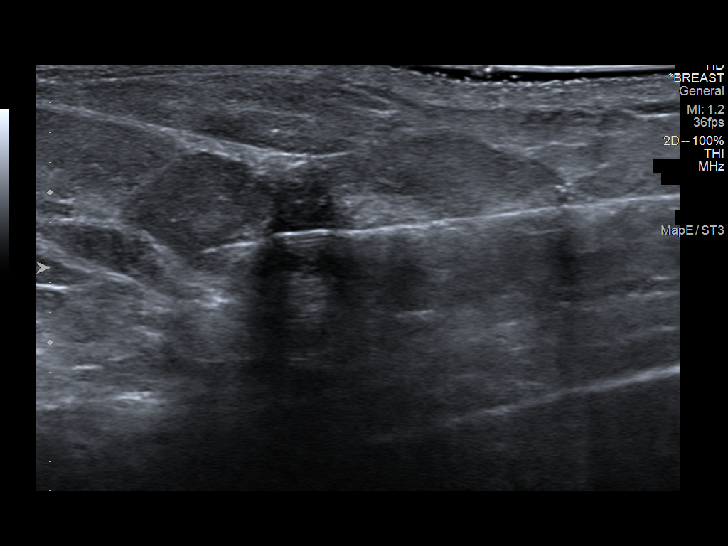
[im 5/10]
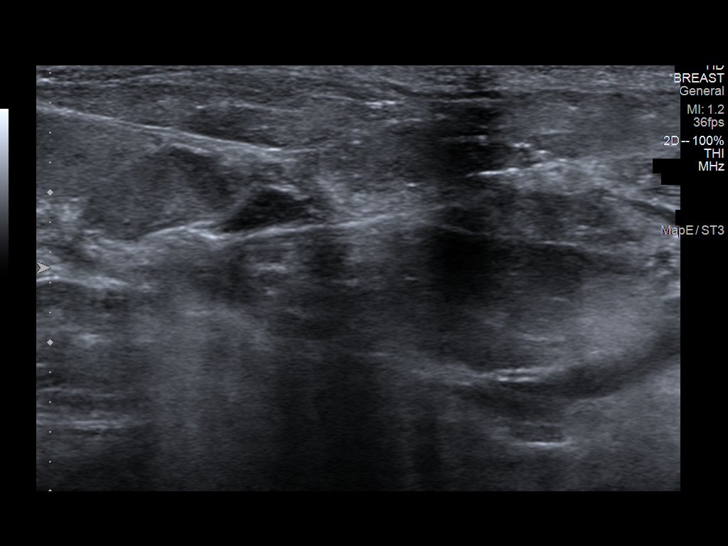
[im 6/10]
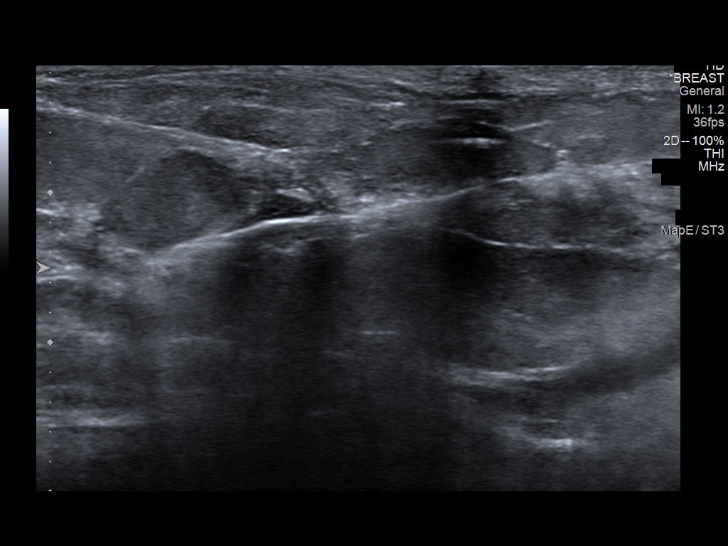
[im 7/10]
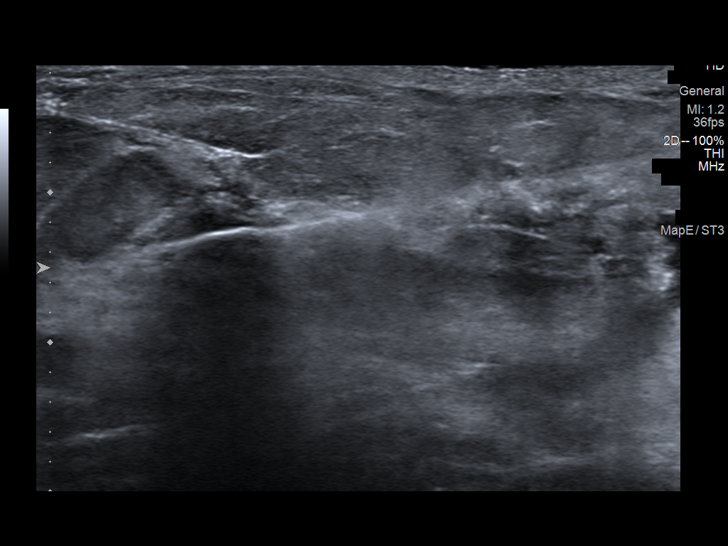
[im 8/10]
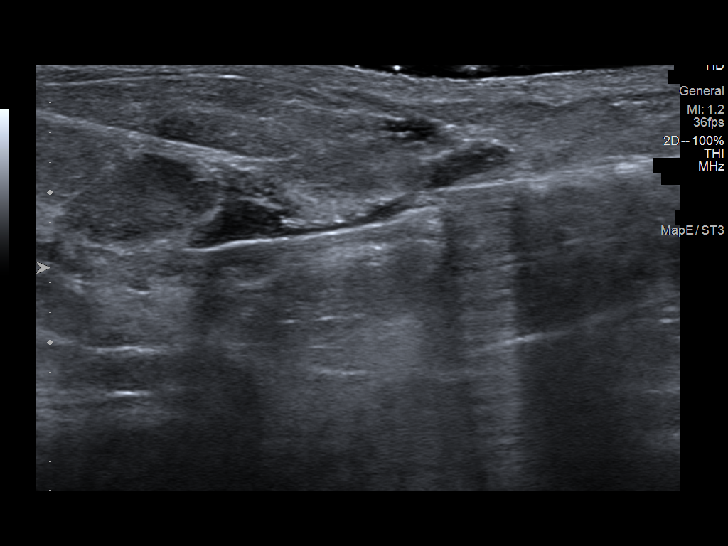
[im 9/10]
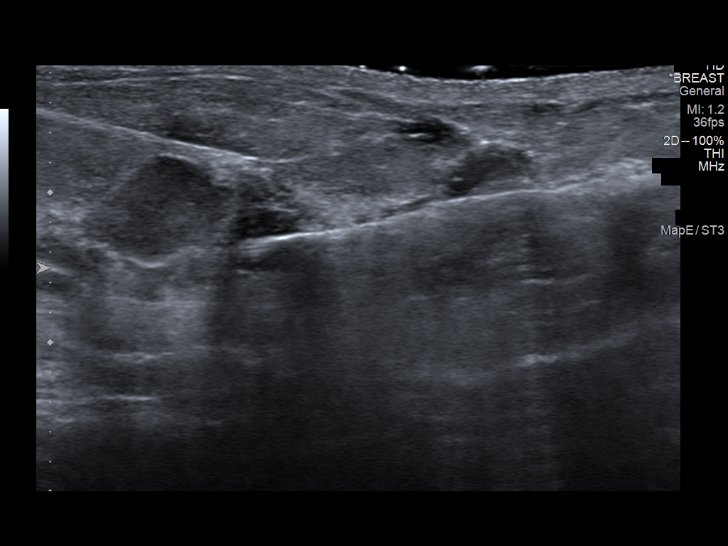
[im 10/10]
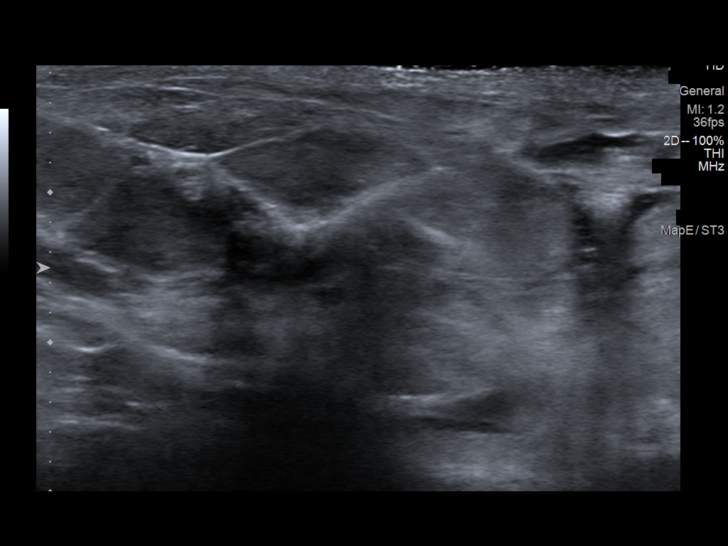

[10 of 10 positions shown; findings below may reference images not displayed]



Lesion quadrant: Lower outer quadrant

Using sterile technique and 1% Lidocaine as local anesthetic, under
direct ultrasound visualization, a 14 gauge [place] device was
used to perform biopsy of a mass in the right breast at 6 o'clock
using a lateral approach. At the conclusion of the procedure a coil
tissue marker clip was deployed into the biopsy cavity. Follow up 2
view mammogram was performed and dictated separately.
IMPRESSION: Ultrasound guided biopsy of a mass in the right breast at 6 o'clock.
No apparent complications.

ADDENDUM:
Pathology revealed FIBROADENOMA of the RIGHT breast, 6:00 o'clock,
(coil clip). This was found to be concordant by Dr. Gato
Caneja.

Pathology results were discussed with the patient by telephone. The
patient reported doing well after the biopsy with tenderness and
bruising at the site. Post biopsy instructions and care were
reviewed and questions were answered. The patient was encouraged to
call The [REDACTED] for any additional
concerns.

The patient was instructed to return for annual high risk screening
mammography.

Pathology results reported by Chinedu Apple, RN on 11/02/2021.



Lesion quadrant: Lower outer quadrant

Using sterile technique and 1% Lidocaine as local anesthetic, under
direct ultrasound visualization, a 14 gauge [place] device was
used to perform biopsy of a mass in the right breast at 6 o'clock
using a lateral approach. At the conclusion of the procedure a coil
tissue marker clip was deployed into the biopsy cavity. Follow up 2
view mammogram was performed and dictated separately.
IMPRESSION: Ultrasound guided biopsy of a mass in the right breast at 6 o'clock.
No apparent complications.

## 2022-11-09 IMAGING — MG MM BREAST LOCALIZATION CLIP
4 series · 4 of 12 positions shown · non-contrast
Comparison: Previous exam(s).

CLINICAL DATA: Post procedure mammogram for clip placement

EXAM:
3D DIAGNOSTIC RIGHT MAMMOGRAM POST ULTRASOUND BIOPSY

[R ML synth-2D]
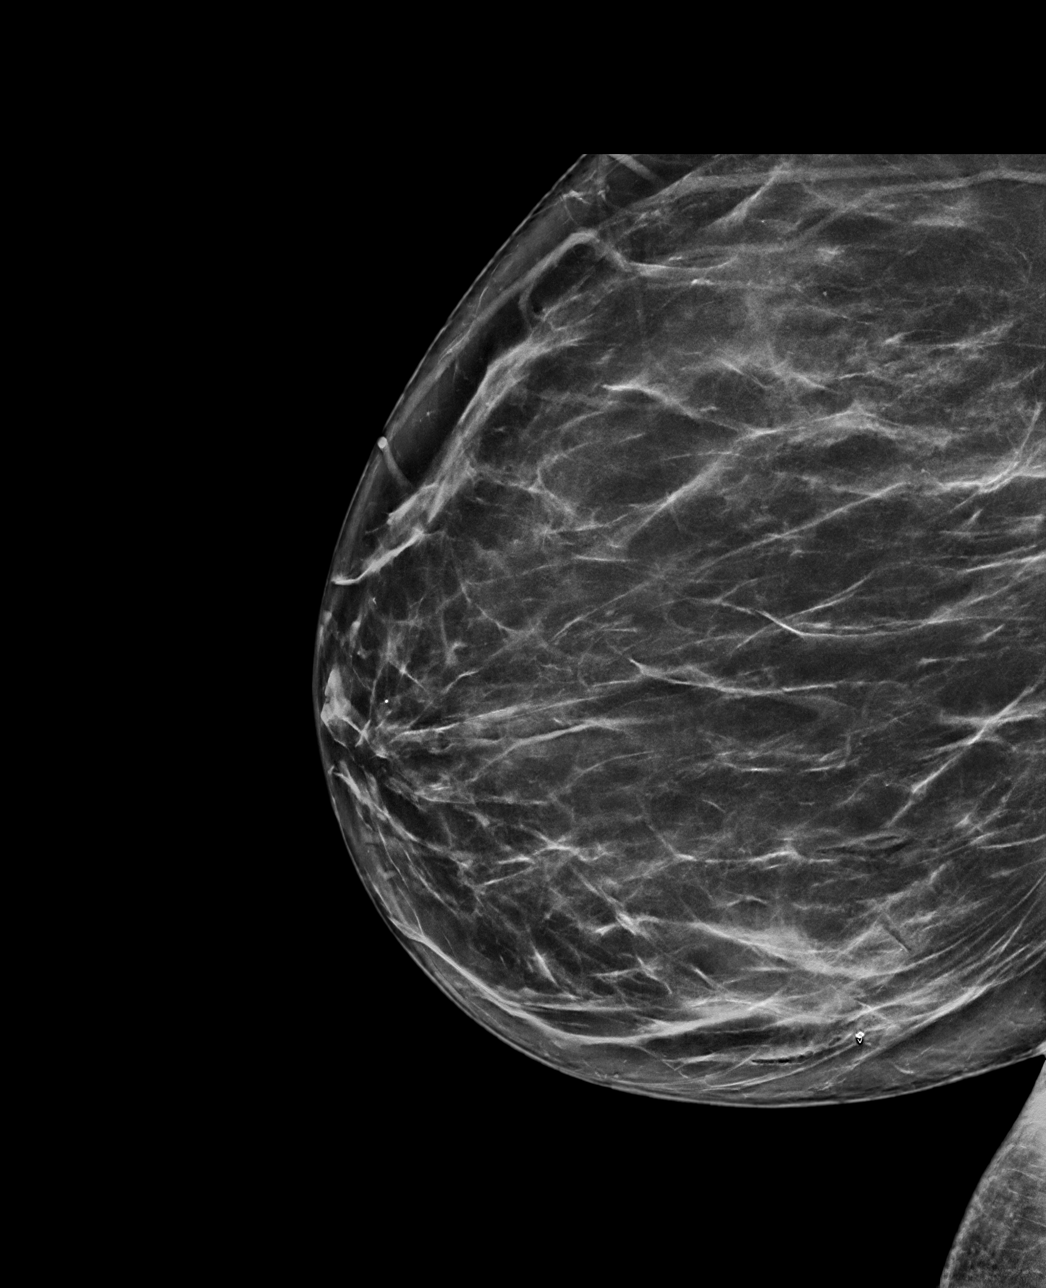

[R CC synth-2D]
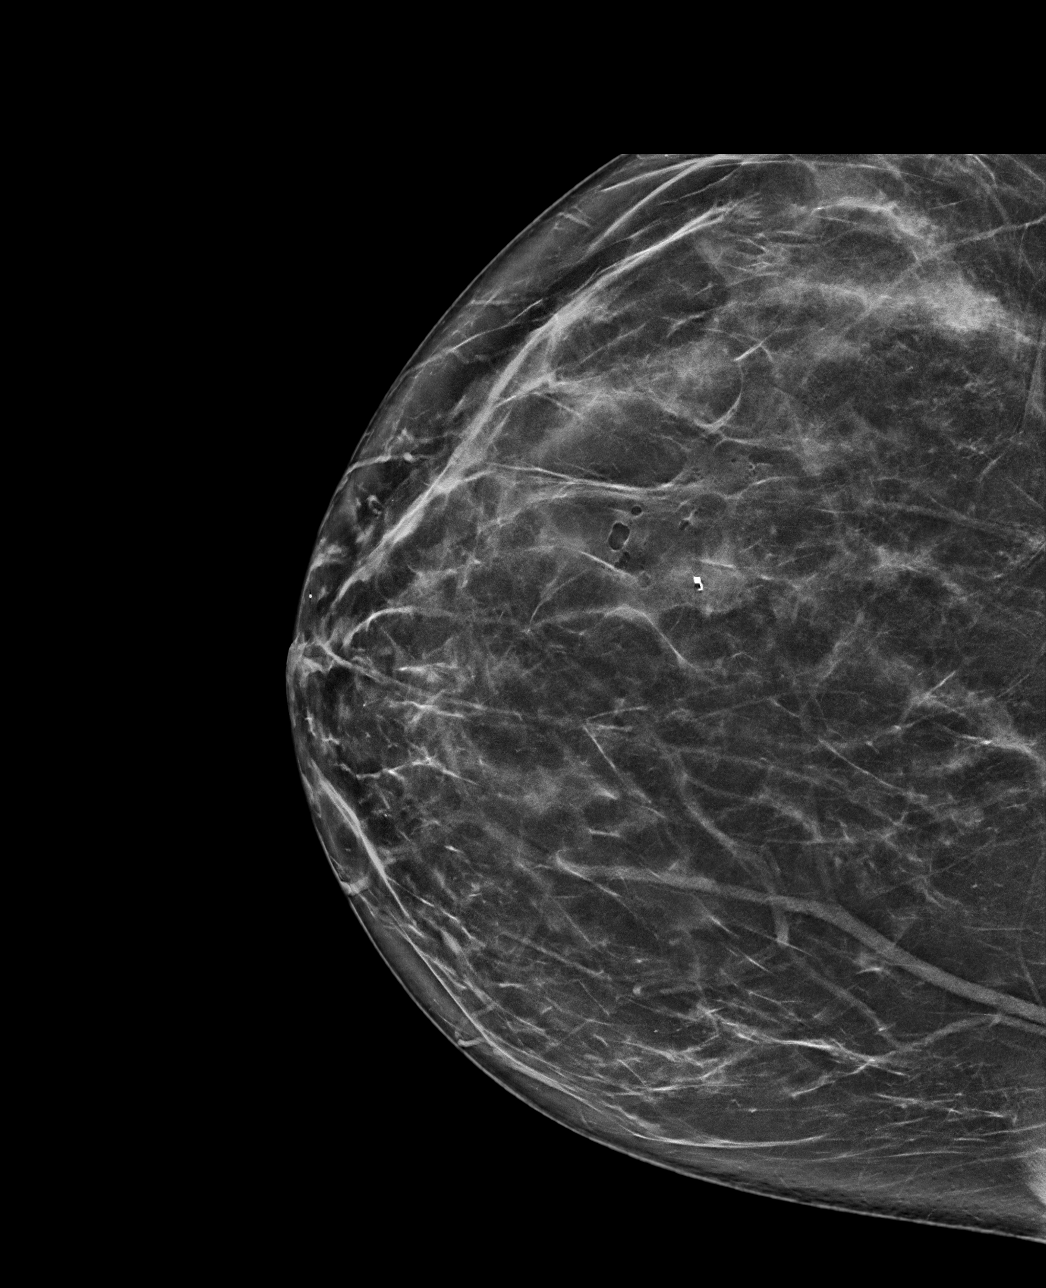

[R ML tomo · tomo slice 44/87.0]
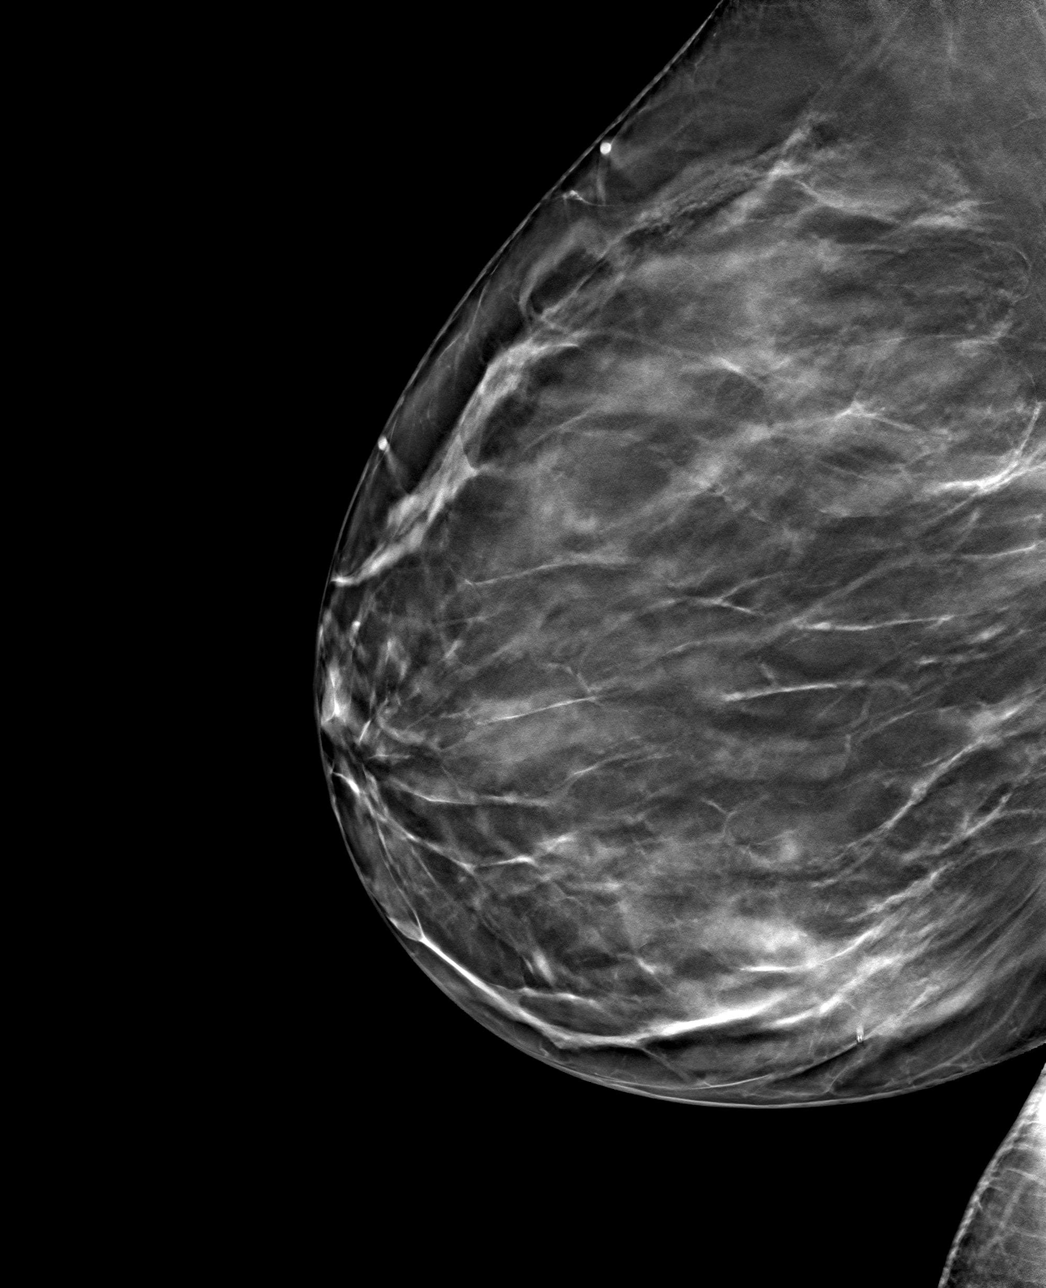

[R CC tomo · tomo slice 43/85.0]
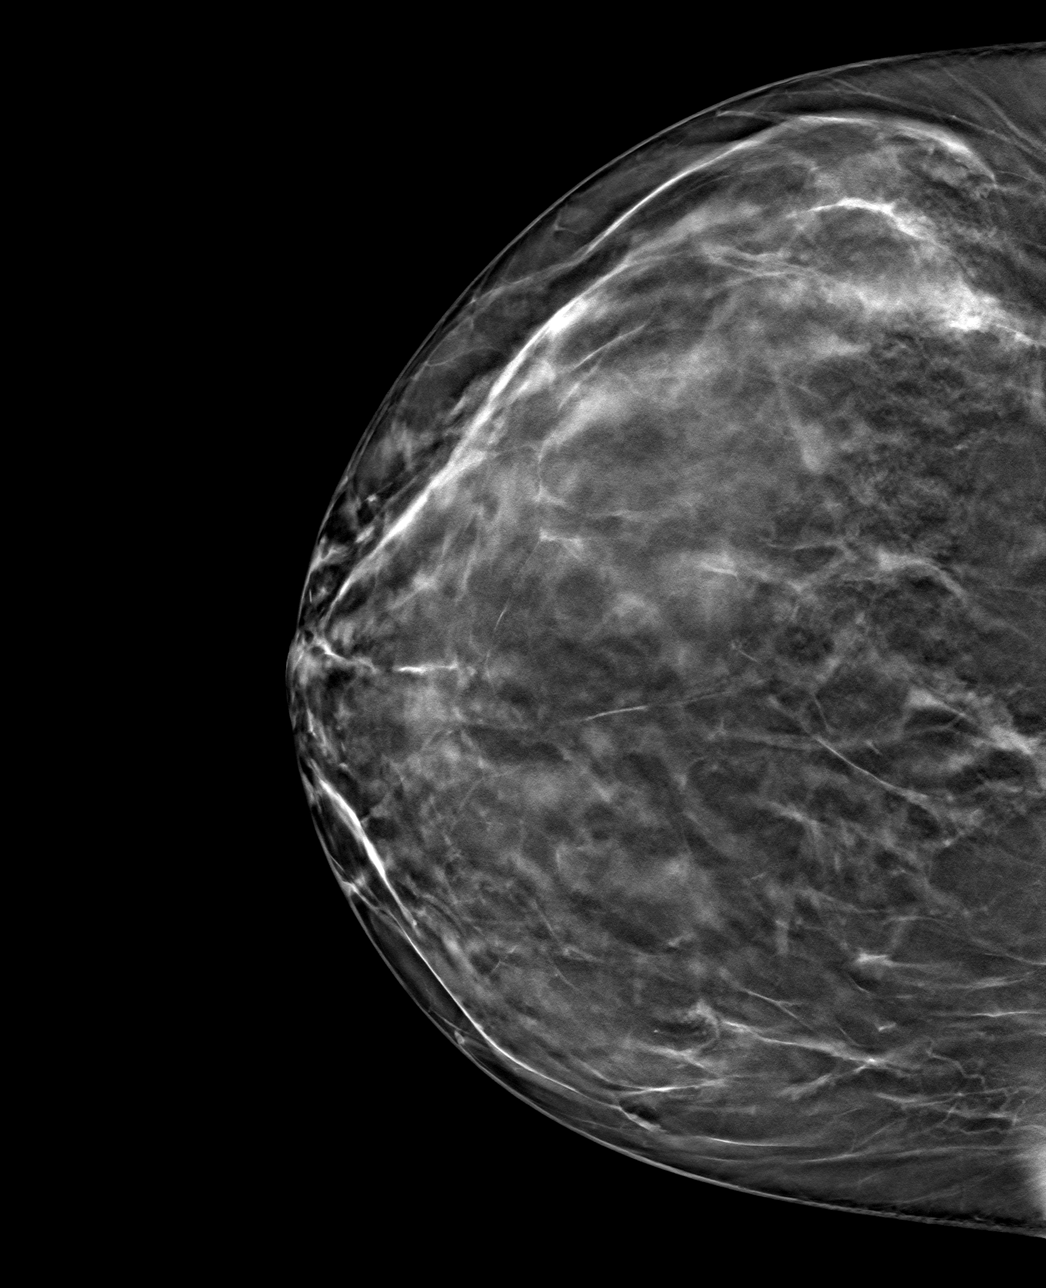

[4 of 12 positions shown; findings below may reference images not displayed]

FINDINGS: 3D Mammographic images were obtained following ultrasound guided
biopsy of a mass in the right breast at 6 o'clock. The biopsy
marking clip is in expected position at the site of biopsy.
IMPRESSION: Appropriate positioning of the coil shaped biopsy marking clip at
the site of biopsy in the right breast 6 o'clock.

Final Assessment: Post Procedure Mammograms for Marker Placement

## 2023-04-22 ENCOUNTER — Encounter: Payer: Self-pay | Admitting: Advanced Practice Midwife

## 2023-05-07 ENCOUNTER — Other Ambulatory Visit: Payer: Self-pay | Admitting: Certified Nurse Midwife

## 2023-05-07 DIAGNOSIS — Z803 Family history of malignant neoplasm of breast: Secondary | ICD-10-CM

## 2023-05-07 DIAGNOSIS — Z1231 Encounter for screening mammogram for malignant neoplasm of breast: Secondary | ICD-10-CM

## 2023-05-20 ENCOUNTER — Other Ambulatory Visit: Payer: Self-pay | Admitting: Certified Nurse Midwife

## 2023-05-20 ENCOUNTER — Encounter: Payer: Self-pay | Admitting: Certified Nurse Midwife

## 2023-05-20 MED ORDER — NORETHIN ACE-ETH ESTRAD-FE 1-20 MG-MCG PO TABS: 1.0000 | ORAL_TABLET | Freq: Every day | ORAL | 11 refills | Status: DC

## 2023-05-28 ENCOUNTER — Ambulatory Visit
Admission: RE | Admit: 2023-05-28 | Discharge: 2023-05-28 | Disposition: A | Discharge status: Home / Self Care | Payer: BC Managed Care – PPO | Source: Ambulatory Visit | Attending: Certified Nurse Midwife | Admitting: Certified Nurse Midwife

## 2023-05-28 DIAGNOSIS — Z1231 Encounter for screening mammogram for malignant neoplasm of breast: Secondary | ICD-10-CM | POA: Diagnosis present

## 2023-05-28 DIAGNOSIS — Z803 Family history of malignant neoplasm of breast: Secondary | ICD-10-CM | POA: Insufficient documentation

## 2023-12-24 ENCOUNTER — Ambulatory Visit: Payer: Self-pay | Admitting: Emergency Medicine

## 2023-12-24 ENCOUNTER — Ambulatory Visit
Admission: RE | Admit: 2023-12-24 | Discharge: 2023-12-24 | Disposition: A | Discharge status: Home / Self Care | Payer: Self-pay | Source: Ambulatory Visit | Attending: Emergency Medicine | Admitting: Emergency Medicine

## 2023-12-24 ENCOUNTER — Ambulatory Visit (INDEPENDENT_AMBULATORY_CARE_PROVIDER_SITE_OTHER)

## 2023-12-24 VITALS — BP 144/95 | HR 88 | Temp 98.4°F | Resp 16 | Ht 70.0 in | Wt 236.0 lb

## 2023-12-24 DIAGNOSIS — R0789 Other chest pain: Secondary | ICD-10-CM | POA: Diagnosis not present

## 2023-12-24 DIAGNOSIS — R079 Chest pain, unspecified: Secondary | ICD-10-CM

## 2023-12-24 MED ORDER — IBUPROFEN 600 MG PO TABS: 600.0000 mg | ORAL_TABLET | Freq: Three times a day (TID) | ORAL | 0 refills | Status: AC | PRN

## 2023-12-24 MED ORDER — PREDNISONE 20 MG PO TABS: 40.0000 mg | ORAL_TABLET | Freq: Every day | ORAL | 0 refills | Status: AC

## 2023-12-24 NOTE — Discharge Instructions (Signed)
 I did not appreciate any pneumonia or other problems on your x-ray.  We will contact you if and only if it comes back abnormal and we need to change management.  Take 60 mg of ibuprofen , 1000 mg of Tylenol  3-4 times a day as needed for pain.  Finish the prednisone , even if you feel better.  I strongly suspect musculoskeletal chest pain/costochondritis.  Go to the ER for chest pressure, heaviness, nausea, cold sweats, palpitations, if this pain radiates up the side of your neck, down your arm, through to your back, or for any other concerns.

## 2023-12-24 NOTE — ED Triage Notes (Addendum)
 Pt presents to UC stating having some chest pain x3 days. Not sure if it is heartburn or something else - Entered by patient. States comes & goes but feels it more after meals. Denies any cardiac hx.

## 2023-12-24 NOTE — ED Provider Notes (Addendum)
 HPI  SUBJECTIVE:  Renee Lawrence is a 33 y.o. female who presents with 3 days of substernal intermittent nonradiating dull chest pain that lasts up to an hour accompanied with occasional headaches.  No radiation of this pain up her neck, down her arm, through to her back.  No nausea, diaphoresis, palpitations, lightheadedness, abdominal pain.  No burning chest pain, waterbrash, belching.  No coughing, wheezing, shortness of breath, hemoptysis.  No calf pain, swelling, surgery in the past 4 weeks, recent immobilization.  She is on OCPs.  No recent viral illness, fevers, pleuritic pain.  Pain is not associated with torso rotation or arm movement.  No trauma to the chest, change in her physical activity, although she lifts her 30 pound infant repeatedly throughout the day.  It is worse with lying down.  There is no exertional component to it.  She has tried ibuprofen  and Tums with improvement in her symptoms.  No antipyretic in the past 6 hours.  Past medical history negative for cancer, PE/DVT, smoking, MI, coronary disease, diabetes, hypertension, hypercholesterolemia, CVA, PAD/PVD, pulmonary disease.  She has a BMI above 30.  Family history negative for early MI.  PCP: None.   Past Medical History:  Diagnosis Date   BRCA negative 04/2014   MyRisk neg   Family history of breast cancer    Genetic testing of female 04/2014   My Risk neg   History of Papanicolaou smear of cervix 02/09/2011; 04/30/2014   neg; neg   Increased risk of breast cancer    IBIS=32% 2015   Obesity     Past Surgical History:  Procedure Laterality Date   BREAST BIOPSY Right    FOOT SURGERY Left 07/2021   Dr Peggye Douglas   TONSILLECTOMY      Family History  Problem Relation Age of Onset   Diabetes Sister    Diabetes Brother        TYPE I - on insulin   Breast cancer Maternal Grandmother 50       has contact   Cancer Maternal Grandfather        lung   Breast cancer Paternal Grandmother 63       again at 80    Breast cancer Paternal Aunt 77       has contact    Social History   Tobacco Use   Smoking status: Never   Smokeless tobacco: Never  Vaping Use   Vaping status: Never Used  Substance Use Topics   Alcohol use: Not Currently    Comment: occas   Drug use: No    No current facility-administered medications for this encounter.  Current Outpatient Medications:    ibuprofen  (ADVIL ) 600 MG tablet, Take 1 tablet (600 mg total) by mouth every 8 (eight) hours as needed., Disp: 30 tablet, Rfl: 0   norethindrone -ethinyl estradiol-FE (JUNEL FE 1/20) 1-20 MG-MCG tablet, Take 1 tablet by mouth daily., Disp: 28 tablet, Rfl: 11   predniSONE  (DELTASONE ) 20 MG tablet, Take 2 tablets (40 mg total) by mouth daily with breakfast for 5 days., Disp: 10 tablet, Rfl: 0   Prenatal Vit-Fe Fumarate-FA (MULTIVITAMIN-PRENATAL) 27-0.8 MG TABS tablet, Take 1 tablet by mouth daily at 12 noon., Disp: , Rfl:   Allergies  Allergen Reactions   Penicillins Swelling     ROS  As noted in HPI.   Physical Exam  BP (!) 144/95 (BP Location: Left Arm)   Pulse 88   Temp 98.4 F (36.9 C) (Oral)   Resp 16  Ht 5' 10 (1.778 m)   Wt 107 kg   LMP 12/10/2023 (Approximate)   SpO2 98%   Breastfeeding No   BMI 33.86 kg/m   Constitutional: Well developed, well nourished, no acute distress Eyes: PERRL, EOMI, conjunctiva normal bilaterally HENT: Normocephalic, atraumatic,mucus membranes moist Respiratory: Clear to auscultation bilaterally, no rales, no wheezing, no rhonchi Cardiovascular: Normal rate and rhythm, no murmurs, no gallops, no rubs.  Positive reproducible chest wall tenderness along the sternal/rib junctions and costochondral junctions bilaterally. GI: Soft, nondistended, normal bowel sounds, nontender, no rebound, no guarding skin: No rash, skin intact Musculoskeletal: Calves symmetric, no edema, no tenderness Neurologic: Alert & oriented x 3, CN III-XII grossly intact, no motor deficits, sensation  grossly intact Psychiatric: Speech and behavior appropriate   ED Course   Medications - No data to display  Orders Placed This Encounter  Procedures   DG Chest 2 View    Standing Status:   Standing    Number of Occurrences:   1    Reason for Exam (SYMPTOM  OR DIAGNOSIS REQUIRED):   Substernal chest pain, rule out acute cardiopulmonary process-pneumothorax, pulmonary infarct, dissection, pneumonia pulmonary edema   ED EKG    Standing Status:   Standing    Number of Occurrences:   1    Reason for Exam:   Chest Pain   EKG 12-Lead    Standing Status:   Standing    Number of Occurrences:   1   No results found for this or any previous visit (from the past 24 hours). DG Chest 2 View Result Date: 12/24/2023 CLINICAL DATA:  Substernal chest pain EXAM: CHEST - 2 VIEW COMPARISON:  Chest x-ray 09/09/2021 FINDINGS: No consolidation, pneumothorax or effusion. No edema. Normal cardiopericardial silhouette. IMPRESSION: No acute cardiopulmonary disease. Electronically Signed   By: Ranell Bring M.D.   On: 12/24/2023 10:29    ED Clinical Impression  1. Musculoskeletal chest pain   2. Chest pain, unspecified type      ED Assessment/Plan      EKG: Normal sinus rhythm, rate 94.  Normal axis, normal intervals.  No hypertrophy.  No ST-T wave changes.  No previous EKG for comparison.  Patient symptomatic while EKG was obtained.  HEART score:   History: Slightly suspicious 0 EKG: Normal 0 Age: Below 45 0 Risk factors: BMI above 30+1 Troponin: Not available  Total score +1.  Patient is at low risk for MACE.  Discussed this with patient.  Cannot use PERC criteria due to OCP use.  However, Wells score for PE 0.  Low suspicion for PE.  Deferring dimer testing.  Suspect musculoskeletal chest pain from repeated lifting of heavy infant.  Will check chest x-ray to evaluate for dissection, pneumothorax, pulmonary edema, pleural effusion, pneumonia.  Reviewed imaging independently.  No acute  cardiopulmonary disease.  Formal radiology report pending.  Will contact patient [phone] if radiology overread differs enough from mine and we need to change management.  Reviewed radiology report.  No acute cardiopulmonary disease consistent with my read. See radiology report for full details.  Sent patient MyChart note notifying her of negative chest x-ray.  Presentation most consistent with musculoskeletal chest pain.  Home with 40 mg of prednisone  for 5 days, ibuprofen /Tylenol  together 3-4 times a day as needed for pain.  Follow-up with PCP of choice as needed, strict ER return precautions given.  Discussed EKG, imaging, MDM, treatment plan, and plan for follow-up with patient Discussed sn/sx that should prompt return to the ED.  patient agrees with plan.   Meds ordered this encounter  Medications   predniSONE  (DELTASONE ) 20 MG tablet    Sig: Take 2 tablets (40 mg total) by mouth daily with breakfast for 5 days.    Dispense:  10 tablet    Refill:  0   ibuprofen  (ADVIL ) 600 MG tablet    Sig: Take 1 tablet (600 mg total) by mouth every 8 (eight) hours as needed.    Dispense:  30 tablet    Refill:  0      *This clinic note was created using Scientist, clinical (histocompatibility and immunogenetics). Therefore, there may be occasional mistakes despite careful proofreading. ?    Van Knee, MD 12/24/23 1007    Van Knee, MD 12/24/23 1058

## 2024-01-28 ENCOUNTER — Ambulatory Visit: Admitting: Obstetrics and Gynecology

## 2024-01-28 NOTE — Progress Notes (Deleted)
 No chief complaint on file.    HPI:      [name] is a 33 y.o. G0P0000 who LMP was No LMP recorded., presents today for her annual examination.  Her menses are infrequent with OCPs. She occas has a few days of spotting. Dysmenorrhea none. She does not have intermenstrual bleeding.   Sex activity: single partner, contraception - OCP (estrogen/progesterone). May want to conceive next yr. Last Pap: 11/08/21 Results were: no abnormalities /neg HPV DNA 2021 Hx of STDs: HPV  Mammo: 05/28/23 Results are normal, repeat in 12 months  There is a strong FH of breast cancer on her mat and pat sides. There is no FH of ovarian cancer. The patient does do self-breast exams. Pt is My Risk neg 10/15; IBIS=32%.   Tobacco use: The patient denies current or previous tobacco use. Alcohol use: social drinker  No drug use. Exercise: moderately active  She does get adequate calcium and Vitamin D in her diet.   Past Medical History:  Diagnosis Date   BRCA negative 04/2014   MyRisk neg   Family history of breast cancer    Genetic testing of female 04/2014   My Risk neg   History of Papanicolaou smear of cervix 02/09/2011; 04/30/2014   neg; neg   Increased risk of breast cancer    IBIS=32% 2015   Obesity     Past Surgical History:  Procedure Laterality Date   BREAST BIOPSY Right    FOOT SURGERY Left 07/2021   Dr Peggye Douglas   TONSILLECTOMY      Family History  Problem Relation Age of Onset   Diabetes Sister    Diabetes Brother        TYPE I - on insulin   Breast cancer Maternal Grandmother 50       has contact   Cancer Maternal Grandfather        lung   Breast cancer Paternal Grandmother 38       again at 66   Breast cancer Paternal Aunt 37       has contact    Social History   Socioeconomic History   Marital status: Married    Spouse name: Not on file   Number of children: 0   Years of education: 16   Highest education level: Not on file  Occupational History    Occupation: TEACHER  Tobacco Use   Smoking status: Never   Smokeless tobacco: Never  Vaping Use   Vaping status: Never Used  Substance and Sexual Activity   Alcohol use: Not Currently    Comment: occas   Drug use: No   Sexual activity: Yes    Birth control/protection: None  Other Topics Concern   Not on file  Social History Narrative   Not on file   Social Drivers of Health   Financial Resource Strain: Low Risk  (10/25/2021)   Overall Financial Resource Strain (CARDIA)    Difficulty of Paying Living Expenses: Not hard at all  Food Insecurity: No Food Insecurity (07/10/2022)   Hunger Vital Sign    Worried About Running Out of Food in the Last Year: Never true    Ran Out of Food in the Last Year: Never true  Transportation Needs: No Transportation Needs (07/10/2022)   PRAPARE - Administrator, Civil Service (Medical): No    Lack of Transportation (Non-Medical): No  Physical Activity: Insufficiently Active (10/25/2021)   Exercise Vital Sign    Days of  Exercise per Week: 3 days    Minutes of Exercise per Session: 30 min  Stress: No Stress Concern Present (10/25/2021)   [employer] of Occupational Health - Occupational Stress Questionnaire    Feeling of Stress : Not at all  Social Connections: Moderately Integrated (10/25/2021)   Social Connection and Isolation Panel    Frequency of Communication with Friends and Family: More than three times a week    Frequency of Social Gatherings with Friends and Family: Three times a week    Attends Religious Services: More than 4 times per year    Active Member of Clubs or Organizations: No    Attends Banker Meetings: Never    Marital Status: Married  Catering manager Violence: Not At Risk (07/10/2022)   Humiliation, Afraid, Rape, and Kick questionnaire    Fear of Current or Ex-Partner: No    Emotionally Abused: No    Physically Abused: No    Sexually Abused: No     Current Outpatient Medications:     ibuprofen  (ADVIL ) 600 MG tablet, Take 1 tablet (600 mg total) by mouth every 8 (eight) hours as needed., Disp: 30 tablet, Rfl: 0   norethindrone -ethinyl estradiol-FE (JUNEL FE 1/20) 1-20 MG-MCG tablet, Take 1 tablet by mouth daily., Disp: 28 tablet, Rfl: 11   Prenatal Vit-Fe Fumarate-FA (MULTIVITAMIN-PRENATAL) 27-0.8 MG TABS tablet, Take 1 tablet by mouth daily at 12 noon., Disp: , Rfl:   ROS:  Review of Systems  Constitutional:  Negative for fatigue, fever and unexpected weight change.  Respiratory:  Negative for cough, shortness of breath and wheezing.   Cardiovascular:  Negative for chest pain, palpitations and leg swelling.  Gastrointestinal:  Negative for abdominal pain, blood in stool, constipation, diarrhea, nausea and vomiting.  Endocrine: Negative for cold intolerance, heat intolerance and polyuria.  Genitourinary:  Negative for dyspareunia, dysuria, flank pain, frequency, genital sores, hematuria, menstrual problem, pelvic pain, urgency, vaginal bleeding, vaginal discharge and vaginal pain.  Musculoskeletal:  Negative for back pain, joint swelling and myalgias.  Skin:  Negative for rash.  Neurological:  Negative for dizziness, syncope, light-headedness, numbness and headaches.  Hematological:  Negative for adenopathy.  Psychiatric/Behavioral:  Negative for agitation, confusion, sleep disturbance and suicidal ideas. The patient is not nervous/anxious.      Objective: There were no vitals taken for this visit.   Physical Exam Constitutional:      Appearance: She is well-developed.  Genitourinary:     Vulva normal.     No vaginal discharge, erythema or tenderness.      Right Adnexa: not tender and no mass present.    Left Adnexa: not tender and no mass present.    No cervical motion tenderness or polyp.     Uterus is not enlarged or tender.  Breasts:    Right: No mass, nipple discharge, skin change or tenderness.     Left: No mass, nipple discharge, skin change or  tenderness.  Neck:     Thyroid: No thyromegaly.  Cardiovascular:     Rate and Rhythm: Normal rate and regular rhythm.     Heart sounds: Normal heart sounds. No murmur heard. Pulmonary:     Effort: Pulmonary effort is normal.     Breath sounds: Normal breath sounds.  Abdominal:     Palpations: Abdomen is soft.     Tenderness: There is no abdominal tenderness. There is no guarding.  Musculoskeletal:        General: Normal range of motion.  Cervical back: Normal range of motion.  Neurological:     General: No focal deficit present.     Mental Status: She is alert and oriented to person, place, and time.     Cranial Nerves: No cranial nerve deficit.  Skin:    General: Skin is warm and dry.  Psychiatric:        Mood and Affect: Mood normal.        Behavior: Behavior normal.        Thought Content: Thought content normal.        Judgment: Judgment normal.  Vitals reviewed.      Assessment/Plan: Encounter for annual routine gynecological examination  Cervical cancer screening - Plan: Cytology - PAP  Screening for HPV (human papillomavirus) - Plan: Cytology - PAP  Encounter for surveillance of contraceptive pills - Plan: norethindrone -ethinyl estradiol (JUNEL FE 1/20) 1-20 MG-MCG tablet; OCP RF.   Encounter for screening mammogram for malignant neoplasm of breast - Plan: MM 3D SCREEN BREAST BILATERAL  Family history of breast cancer--MyRisk neg.   Increased risk of breast cancer--IBIS=32%. Cont monthly SBE, yearly CBE, Start yearly mammos this yr, also can do yearly scr breast MRI. Pt to f/u after mammo for ref prn. Add Vit D supp.   Pre-conception counseling--start PNVs before conception.  Need for immunization against influenza - Plan: Flu Vaccine QUAD 36+ mos IM   No orders of the defined types were placed in this encounter.   GYN counsel breast self exam, mammography screening, adequate intake of calcium and vitamin D, diet and exercise     F/U  No follow-ups  on file.  Alira Fretwell B. Enoch Moffa, PA-C 01/28/2024 12:22 PM

## 2024-04-05 ENCOUNTER — Other Ambulatory Visit: Payer: Self-pay | Admitting: Certified Nurse Midwife

## 2024-04-06 ENCOUNTER — Encounter: Payer: Self-pay | Admitting: Certified Nurse Midwife

## 2024-04-06 MED ORDER — NORETHIN ACE-ETH ESTRAD-FE 1-20 MG-MCG PO TABS: 1.0000 | ORAL_TABLET | Freq: Every day | ORAL | 0 refills | Status: DC

## 2024-04-28 ENCOUNTER — Ambulatory Visit: Admitting: Obstetrics and Gynecology

## 2024-05-03 ENCOUNTER — Other Ambulatory Visit: Payer: Self-pay | Admitting: Certified Nurse Midwife

## 2024-05-04 NOTE — Progress Notes (Unsigned)
 Gynecology Annual Exam   PCP: Pcp, No  Chief Complaint: No chief complaint on file.   History of Present Illness: Patient is a 33 y.o. G1P1001 presents for annual exam. The patient has no complaints today.   LMP: No LMP recorded. Average Interval: {Desc; regular/irreg:14544}, {numbers 22-35:14824} days Duration of flow: {numbers; 0-10:33138} days Heavy Menses: {yes/no:63} Clots: {yes/no:63} Intermenstrual Bleeding: {yes/no:63} Postcoital Bleeding: {yes/no:63} Dysmenorrhea: {yes/no:63}  The patient {sys sexually active:13135} sexually active. She currently uses {method:5051} for contraception. She {has/denies:315300} dyspareunia.  The patient {DOES_DOES WNU:81435} perform self breast exams.  There {is/is no:19420} notable family history of breast or ovarian cancer in her family.  The patient wears seatbelts: {yes/no:63}.   The patient has regular exercise: {yes/no/not asked:9010}.    The patient {Blank single:19197::reports,denies} current symptoms of depression.    Review of Systems: ROS  Past Medical History:  Patient Active Problem List   Diagnosis Date Noted   Labor and delivery indication for care or intervention 08/30/2022   Increased risk of breast cancer     IBIS=32% 2015    Family history of breast cancer     Past Surgical History:  Past Surgical History:  Procedure Laterality Date   BREAST BIOPSY Right    FOOT SURGERY Left 07/2021   Dr Peggye Douglas   TONSILLECTOMY      Gynecologic History:  No LMP recorded. Contraception: OCP (estrogen/progesterone) Last Pap: Results were: NIL and HR HPV negative   Obstetric History: G1P1001  Family History:  Family History  Problem Relation Age of Onset   Diabetes Sister    Diabetes Brother        TYPE I - on insulin   Breast cancer Maternal Grandmother 51       has contact   Cancer Maternal Grandfather        lung   Breast cancer Paternal Grandmother 8       again at 65   Breast cancer Paternal  Aunt 77       has contact    Social History:  Social History   Socioeconomic History   Marital status: Married    Spouse name: Not on file   Number of children: 0   Years of education: 16   Highest education level: Not on file  Occupational History   Occupation: TEACHER  Tobacco Use   Smoking status: Never   Smokeless tobacco: Never  Vaping Use   Vaping status: Never Used  Substance and Sexual Activity   Alcohol use: Not Currently    Comment: occas   Drug use: No   Sexual activity: Yes    Birth control/protection: None  Other Topics Concern   Not on file  Social History Narrative   Not on file   Social Drivers of Health   Financial Resource Strain: Low Risk  (10/25/2021)   Overall Financial Resource Strain (CARDIA)    Difficulty of Paying Living Expenses: Not hard at all  Food Insecurity: No Food Insecurity (07/10/2022)   Hunger Vital Sign    Worried About Running Out of Food in the Last Year: Never true    Ran Out of Food in the Last Year: Never true  Transportation Needs: No Transportation Needs (07/10/2022)   PRAPARE - Administrator, Civil Service (Medical): No    Lack of Transportation (Non-Medical): No  Physical Activity: Insufficiently Active (10/25/2021)   Exercise Vital Sign    Days of Exercise per Week: 3 days    Minutes  of Exercise per Session: 30 min  Stress: No Stress Concern Present (10/25/2021)   [employer] of Occupational Health - Occupational Stress Questionnaire    Feeling of Stress : Not at all  Social Connections: Moderately Integrated (10/25/2021)   Social Connection and Isolation Panel    Frequency of Communication with Friends and Family: More than three times a week    Frequency of Social Gatherings with Friends and Family: Three times a week    Attends Religious Services: More than 4 times per year    Active Member of Clubs or Organizations: No    Attends Banker Meetings: Never    Marital Status: Married   Catering Manager Violence: Not At Risk (07/10/2022)   Humiliation, Afraid, Rape, and Kick questionnaire    Fear of Current or Ex-Partner: No    Emotionally Abused: No    Physically Abused: No    Sexually Abused: No    Allergies:  Allergies  Allergen Reactions   Penicillins Swelling    Medications: Prior to Admission medications   Medication Sig Start Date End Date Taking? Authorizing Provider  ibuprofen  (ADVIL ) 600 MG tablet Take 1 tablet (600 mg total) by mouth every 8 (eight) hours as needed. 12/24/23   Mortenson, Ashley, MD  JUNEL FE 1/20 1-20 MG-MCG tablet TAKE 1 TABLET BY MOUTH EVERY DAY 05/04/24   Sebastian Sham, CNM  Prenatal Vit-Fe Fumarate-FA (MULTIVITAMIN-PRENATAL) 27-0.8 MG TABS tablet Take 1 tablet by mouth daily at 12 noon.    [provider]    Physical Exam Vitals: There were no vitals taken for this visit.  General: NAD HEENT: normocephalic, anicteric Thyroid: no enlargement, no palpable nodules Pulmonary: No increased work of breathing, CTAB Cardiovascular: RRR, distal pulses 2+ Breast: Breast symmetrical, no tenderness, no palpable nodules or masses, no skin or nipple retraction present, no nipple discharge.  No axillary or supraclavicular lymphadenopathy. Abdomen: NABS, soft, non-tender, non-distended.  Umbilicus without lesions.  No hepatomegaly, splenomegaly or masses palpable. No evidence of hernia  Genitourinary:  External: Normal external female genitalia.  Normal urethral meatus, normal Bartholin's and Skene's glands.    Vagina: Normal vaginal mucosa, no evidence of prolapse.    Cervix: Grossly normal in appearance, no bleeding  Uterus: Non-enlarged, mobile, normal contour.  No CMT  Adnexa: ovaries non-enlarged, no adnexal masses  Rectal: deferred  Lymphatic: no evidence of inguinal lymphadenopathy Extremities: no edema, erythema, or tenderness Neurologic: Grossly intact Psychiatric: mood appropriate, affect full  Female chaperone present  for pelvic and breast  portions of the physical exam    Assessment: 33 y.o. G1P1001 routine annual exam  Plan: Problem List Items Addressed This Visit   None   2) STI screening  {Blank single:19197::was,was not}offered and {Blank single:19197::accepted,declined,therefore not obtained}  2)  ASCCP guidelines and rational discussed.  Patient opts for every 5 years screening interval  3) Contraception - the patient is currently using  OCP (estrogen/progesterone).  She is {Blank single:19197::happy with her current form of contraception and plans to continue,interested in changing to ***,interested in starting Contraception: ***,not currently in need of contraception secondary to being sterile,attempting to conceive in the near future}  4) Routine healthcare maintenance including cholesterol, diabetes screening discussed {Blank single:19197::managed by PCP,Ordered today,To return fasting at a later date,Declines}  5) No follow-ups on file.     Slater Rains, CNM [facility]  05/04/2024 11:48 AM

## 2024-05-07 ENCOUNTER — Ambulatory Visit (INDEPENDENT_AMBULATORY_CARE_PROVIDER_SITE_OTHER): Admitting: Advanced Practice Midwife

## 2024-05-07 ENCOUNTER — Encounter: Payer: Self-pay | Admitting: Advanced Practice Midwife

## 2024-05-07 VITALS — BP 123/81 | HR 82 | Resp 16 | Ht 70.0 in | Wt 244.7 lb

## 2024-05-07 DIAGNOSIS — Z1321 Encounter for screening for nutritional disorder: Secondary | ICD-10-CM

## 2024-05-07 DIAGNOSIS — Z23 Encounter for immunization: Secondary | ICD-10-CM | POA: Diagnosis not present

## 2024-05-07 DIAGNOSIS — Z0000 Encounter for general adult medical examination without abnormal findings: Secondary | ICD-10-CM

## 2024-05-07 DIAGNOSIS — Z131 Encounter for screening for diabetes mellitus: Secondary | ICD-10-CM

## 2024-05-07 DIAGNOSIS — Z13220 Encounter for screening for lipoid disorders: Secondary | ICD-10-CM

## 2024-05-07 DIAGNOSIS — Z01419 Encounter for gynecological examination (general) (routine) without abnormal findings: Secondary | ICD-10-CM

## 2024-05-07 DIAGNOSIS — Z3041 Encounter for surveillance of contraceptive pills: Secondary | ICD-10-CM

## 2024-05-07 DIAGNOSIS — Z1329 Encounter for screening for other suspected endocrine disorder: Secondary | ICD-10-CM

## 2024-05-07 MED ORDER — NORETHIN ACE-ETH ESTRAD-FE 1-20 MG-MCG PO TABS
1.0000 | ORAL_TABLET | Freq: Every day | ORAL | 3 refills | Status: AC
Start: 2024-05-07 — End: ?

## 2024-05-07 NOTE — Patient Instructions (Addendum)
 Influenza (Flu) Vaccine (Inactivated or Recombinant): What You Need to Know  Many vaccine information statements are available in Spanish and other languages. See promoage.com.br. 1. Why get vaccinated? Influenza vaccine can prevent influenza (flu). Flu is a contagious disease that spreads around the [place]  every year, usually between October and May. Anyone can get the flu, but it is more dangerous for some people. Infants and young children, people 50 years and older, pregnant people, and people with certain health conditions or a weakened immune system are at greatest risk of flu complications. Pneumonia, bronchitis, sinus infections, and ear infections are examples of flu-related complications. If you have a medical condition, such as heart disease, cancer, or diabetes, flu can make it worse. Flu can cause fever and chills, sore throat, muscle aches, fatigue, cough, headache, and runny or stuffy nose. Some people may have vomiting and diarrhea, though this is more common in children than adults. In an average year, thousands of people in the [place]  die from flu, and many more are hospitalized. Flu vaccine prevents millions of illnesses and flu-related visits to the doctor each year. 2. Influenza vaccines CDC recommends everyone 6 months and older get vaccinated every flu season. Children 6 months through 12 years of age may need 2 doses during a single flu season. Everyone else needs only 1 dose each flu season. It takes about 2 weeks for protection to develop after vaccination. There are many flu viruses, and they are always changing. Each year a new flu vaccine is made to protect against the influenza viruses believed to be likely to cause disease in the upcoming flu season. Even when the vaccine doesn't exactly match these viruses, it may still provide some protection. Influenza vaccine does not cause flu. Influenza vaccine may be given at the same time as other vaccines. 3.  Talk with your health care provider Tell your vaccination provider if the person getting the vaccine: Has had an allergic reaction after a previous dose of influenza vaccine, or has any severe, life-threatening allergies Has ever had Guillain-Barr Syndrome (also called GBS) In some cases, your health care provider may decide to postpone influenza vaccination until a future visit. Influenza vaccine can be administered at any time during pregnancy. People who are or will be pregnant during influenza season should receive inactivated influenza vaccine. People with minor illnesses, such as a cold, may be vaccinated. People who are moderately or severely ill should usually wait until they recover before getting influenza vaccine. Your health care provider can give you more information. 4. Risks of a vaccine reaction Soreness, redness, and swelling where the shot is given, fever, muscle aches, and headache can happen after influenza vaccination. There may be a very small increased risk of Guillain-Barr Syndrome (GBS) after inactivated influenza vaccine (the flu shot). Young children who get the flu shot along with pneumococcal vaccine (PCV13) and/or DTaP vaccine at the same time might be slightly more likely to have a seizure caused by fever. Tell your health care provider if a child who is getting flu vaccine has ever had a seizure. People sometimes faint after medical procedures, including vaccination. Tell your provider if you feel dizzy or have vision changes or ringing in the ears. As with any medicine, there is a very remote chance of a vaccine causing a severe allergic reaction, other serious injury, or death. 5. What if there is a serious problem? An allergic reaction could occur after the vaccinated person leaves the clinic. If you see signs  of a severe allergic reaction (hives, swelling of the face and throat, difficulty breathing, a fast heartbeat, dizziness, or weakness), call 9-1-1 and get  the person to the nearest hospital. For other signs that concern you, call your health care provider. Adverse reactions should be reported to the Vaccine Adverse Event Reporting System (VAERS). Your health care provider will usually file this report, or you can do it yourself. Visit the VAERS website at www.vaers.lagents.no or call (351)332-6575. VAERS is only for reporting reactions, and VAERS staff members do not give medical advice. 6. The National Vaccine Injury Compensation Program The [employer] Vaccine Injury Compensation Program (VICP) is a federal program that was created to compensate people who may have been injured by certain vaccines. Claims regarding alleged injury or death due to vaccination have a time limit for filing, which may be as short as two years. Visit the VICP website at spiritualword.at or call 820-528-6825 to learn about the program and about filing a claim. 7. How can I learn more? Ask your health care provider. Call your local or state health department. Visit the website of the Food and Drug Administration (FDA) for vaccine package inserts and additional information at finderlist.no. Contact the Centers for Disease Control and Prevention (CDC): Call (580)413-1628 (1-800-CDC-INFO) or Visit CDC's website at biotechroom.com.cy. Source: CDC Vaccine Information Statement Inactivated Influenza Vaccine (02/05/2020) This same material is available at footballexhibition.com.br for no charge. This information is not intended to replace advice given to you by your health care provider. Make sure you discuss any questions you have with your health care provider. Document Revised: 10/03/2022 Document Reviewed: 07/09/2022 Elsevier Patient Education  2024 [employer].  How to Do a Breast Self-Exam  Doing breast self-exams can help you stay healthy. They're one way to know what's normal for your breasts. They can help you catch a problem while it's  still small and can be treated. You need to: Check your breasts often. Tell your doctor about any changes. You should do breast self-exams even if you have breast implants. What you need: A mirror. A well-lit room. A pillow or other soft object. How to do a breast self-exam Look for changes  Take off all the clothes above your waist. Stand in front of a mirror in a room with good lighting. Put your hands down at your sides. Compare your breasts in the mirror. Look for difference between them, such as: Differences in shape. Differences in size. Wrinkles, dips, and bumps in one breast and not the other. Look at each breast for skin changes, such as: Redness. Scaly spots. Spots where your skin is thicker. Dimpling. Open sores. Look for changes in your nipples, such as: Fluid coming out of a nipple. Fluid around a nipple. Bleeding. Dimpling. Redness. A nipple that looks pushed in or that has changed position. Feel for changes Lie on your back. Feel each breast. To do this: Pick a breast to feel. Place a pillow under the shoulder closest to that breast. Put the arm closest to that breast behind your head. Feel the breast using the hand of your other arm. Use the pads of your three middle fingers to make small circles starting near the nipple. Use light, medium, and firm pressure. Keep making circles, moving down over the breast. Stop when you feel your ribs. Start making circles with your fingers again, this time going up until you reach your collarbone. Then, make circles out across your breast and into your armpit area. Squeeze your nipple. Check  for fluid and lumps. Do these steps again to check your other breast. Sit or stand in the tub or shower. With soapy water on your skin, feel each breast the same way you did when you were lying down. Write down what you find Writing down what you find can help you keep track of what you want to tell your doctor. Write  down: What's normal for each breast. Any changes you find. Write down: The kind of change. If your breast feels tender or painful. Any lump you find. Write down its size and where it is. When you last had your period. General tips If you're breastfeeding, the best time to check your breasts is after you feed your baby or after you use a breast pump. If you get a period, the best time to check your breasts is 5-7 days after your period ends. With time, you'll get more used to doing the self-exam. You'll also start to know if there are changes in your breasts. Contact a doctor if: You see a change in the shape or size of your breasts or nipples. You see a change in the skin of your breast or nipples. You have fluid coming from your nipples that isn't normal. You find a new lump or thick area. You have breast pain. You have any concerns about your breast health. This information is not intended to replace advice given to you by your health care provider. Make sure you discuss any questions you have with your health care provider. Document Revised: 08/28/2023 Document Reviewed: 08/28/2023 Elsevier Patient Education  2025 [employer].  Preventive Care 21-32 Years Old, Female  Preventive care refers to lifestyle choices and visits with your health care provider that can promote health and wellness. Preventive care visits are also called wellness exams. What can I expect for my preventive care visit? Counseling During your preventive care visit, your health care provider may ask about your: Medical history, including: Past medical problems. Family medical history. Pregnancy history. Current health, including: Menstrual cycle. Method of birth control. Emotional well-being. Home life and relationship well-being. Sexual activity and sexual health. Lifestyle, including: Alcohol, nicotine or tobacco, and drug use. Access to firearms. Diet, exercise, and sleep habits. Work and work  astronomer. Sunscreen use. Safety issues such as seatbelt and bike helmet use. Physical exam Your health care provider may check your: Height and weight. These may be used to calculate your BMI (body mass index). BMI is a measurement that tells if you are at a healthy weight. Waist circumference. This measures the distance around your waistline. This measurement also tells if you are at a healthy weight and may help predict your risk of certain diseases, such as type 2 diabetes and high blood pressure. Heart rate and blood pressure. Body temperature. Skin for abnormal spots. What immunizations do I need?  Vaccines are usually given at various ages, according to a schedule. Your health care provider will recommend vaccines for you based on your age, medical history, and lifestyle or other factors, such as travel or where you work. What tests do I need? Screening Your health care provider may recommend screening tests for certain conditions. This may include: Pelvic exam and Pap test. Lipid and cholesterol levels. Diabetes screening. This is done by checking your blood sugar (glucose) after you have not eaten for a while (fasting). Hepatitis B test. Hepatitis C test. HIV (human immunodeficiency virus) test. STI (sexually transmitted infection) testing, if you are at risk. BRCA-related cancer screening. This  may be done if you have a family history of breast, ovarian, tubal, or peritoneal cancers. Talk with your health care provider about your test results, treatment options, and if necessary, the need for more tests. Follow these instructions at home: Eating and drinking  Eat a healthy diet that includes fresh fruits and vegetables, whole grains, lean protein, and low-fat dairy products. Take vitamin and mineral supplements as recommended by your health care provider. Do not drink alcohol if: Your health care provider tells you not to drink. You are pregnant, may be pregnant, or are  planning to become pregnant. If you drink alcohol: Limit how much you have to 0-1 drink a day. Know how much alcohol is in your drink. In the [place] one drink equals one 12 oz bottle of beer (355 mL), one 5 oz glass of wine (148 mL), or one 1 oz glass of hard liquor (44 mL). Lifestyle Brush your teeth every morning and night with fluoride toothpaste. Floss one time each day. Exercise for at least 30 minutes 5 or more days each week. Do not use any products that contain nicotine or tobacco. These products include cigarettes, chewing tobacco, and vaping devices, such as e-cigarettes. If you need help quitting, ask your health care provider. Do not use drugs. If you are sexually active, practice safe sex. Use a condom or other form of protection to prevent STIs. If you do not wish to become pregnant, use a form of birth control. If you plan to become pregnant, see your health care provider for a prepregnancy visit. Find healthy ways to manage stress, such as: Meditation, yoga, or listening to music. Journaling. Talking to a trusted person. Spending time with friends and family. Minimize exposure to UV radiation to reduce your risk of skin cancer. Safety Always wear your seat belt while driving or riding in a vehicle. Do not drive: If you have been drinking alcohol. Do not ride with someone who has been drinking. If you have been using any mind-altering substances or drugs. While texting. When you are tired or distracted. Wear a helmet and other protective equipment during sports activities. If you have firearms in your house, make sure you follow all gun safety procedures. Seek help if you have been physically or sexually abused. What's next? Go to your health care provider once a year for an annual wellness visit. Ask your health care provider how often you should have your eyes and teeth checked. Stay up to date on all vaccines. This information is not intended to replace advice given  to you by your health care provider. Make sure you discuss any questions you have with your health care provider. Document Revised: 12/14/2020 Document Reviewed: 12/14/2020 Elsevier Patient Education  2024 [employer].

## 2024-05-08 LAB — LIPID PANEL
Chol/HDL Ratio: 4.3 ratio (ref 0.0–4.4)
Cholesterol, Total: 237 mg/dL — ABNORMAL HIGH (ref 100–199)
HDL: 55 mg/dL (ref >39)
LDL Chol Calc (NIH): 150 mg/dL — ABNORMAL HIGH (ref 0–99)
Triglycerides: 176 mg/dL — ABNORMAL HIGH (ref 0–149)
VLDL Cholesterol Cal: 32 mg/dL (ref 5–40)

## 2024-05-08 LAB — COMPREHENSIVE METABOLIC PANEL WITH GFR
ALT: 10 IU/L (ref 0–32)
AST: 13 IU/L (ref 0–40)
Albumin: 4.3 g/dL (ref 3.9–4.9)
Alkaline Phosphatase: 75 IU/L (ref 41–116)
BUN/Creatinine Ratio: 11 (ref 9–23)
BUN: 8 mg/dL (ref 6–20)
Bilirubin Total: 0.4 mg/dL (ref 0.0–1.2)
CO2: 20 mmol/L (ref 20–29)
Calcium: 9.8 mg/dL (ref 8.7–10.2)
Chloride: 102 mmol/L (ref 96–106)
Creatinine, Ser: 0.73 mg/dL (ref 0.57–1.00)
Globulin, Total: 2.7 g/dL (ref 1.5–4.5)
Glucose: 85 mg/dL (ref 70–99)
Potassium: 4.6 mmol/L (ref 3.5–5.2)
Sodium: 139 mmol/L (ref 134–144)
Total Protein: 7 g/dL (ref 6.0–8.5)
eGFR: 111 mL/min/1.73 (ref >59)

## 2024-05-08 LAB — CBC
Hematocrit: 44.4 % (ref 34.0–46.6)
Hemoglobin: 14.2 g/dL (ref 11.1–15.9)
MCH: 29 pg (ref 26.6–33.0)
MCHC: 32 g/dL (ref 31.5–35.7)
MCV: 91 fL (ref 79–97)
Platelets: 319 x10E3/uL (ref 150–450)
RBC: 4.9 x10E6/uL (ref 3.77–5.28)
RDW: 12.5 % (ref 11.7–15.4)
WBC: 7.7 x10E3/uL (ref 3.4–10.8)

## 2024-05-08 LAB — HEMOGLOBIN A1C
Est. average glucose Bld gHb Est-mCnc: 108 mg/dL
Hgb A1c MFr Bld: 5.4 % (ref 4.8–5.6)

## 2024-05-08 LAB — VITAMIN D 25 HYDROXY (VIT D DEFICIENCY, FRACTURES): Vit D, 25-Hydroxy: 24.9 ng/mL — ABNORMAL LOW (ref 30.0–100.0)

## 2024-05-08 LAB — TSH: TSH: 1.2 u[IU]/mL (ref 0.450–4.500)

## 2024-05-16 ENCOUNTER — Ambulatory Visit: Payer: Self-pay | Admitting: Advanced Practice Midwife
